# Patient Record
Sex: Female | Born: 1955 | Race: White | Hispanic: No | Marital: Married | Smoking: Former smoker
Health system: Southern US, Community
[De-identification: ages and names within clinical notes are randomized; demographics above are authoritative.]

## PROBLEM LIST (undated history)

## (undated) DIAGNOSIS — J45909 Unspecified asthma, uncomplicated: Secondary | ICD-10-CM

## (undated) DIAGNOSIS — H8109 Meniere's disease, unspecified ear: Secondary | ICD-10-CM

## (undated) DIAGNOSIS — M069 Rheumatoid arthritis, unspecified: Secondary | ICD-10-CM

## (undated) HISTORY — DX: Rheumatoid arthritis, unspecified: M06.9

## (undated) HISTORY — DX: Unspecified asthma, uncomplicated: J45.909

## (undated) HISTORY — PX: ABDOMINAL HYSTERECTOMY: SHX81

## (undated) HISTORY — PX: CHOLECYSTECTOMY: SHX55

---

## 2014-06-05 ENCOUNTER — Encounter: Payer: Self-pay | Admitting: Physician Assistant

## 2014-06-05 ENCOUNTER — Ambulatory Visit (INDEPENDENT_AMBULATORY_CARE_PROVIDER_SITE_OTHER): Payer: PRIVATE HEALTH INSURANCE | Admitting: Physician Assistant

## 2014-06-05 VITALS — BP 142/84 | HR 105 | Ht 61.0 in | Wt 181.0 lb

## 2014-06-05 DIAGNOSIS — M069 Rheumatoid arthritis, unspecified: Secondary | ICD-10-CM

## 2014-06-05 DIAGNOSIS — J45901 Unspecified asthma with (acute) exacerbation: Secondary | ICD-10-CM

## 2014-06-05 DIAGNOSIS — J45909 Unspecified asthma, uncomplicated: Secondary | ICD-10-CM | POA: Insufficient documentation

## 2014-06-05 MED ORDER — MONTELUKAST SODIUM 10 MG PO TABS: 10.0000 mg | ORAL_TABLET | Freq: Every day | ORAL | Status: DC

## 2014-06-05 MED ORDER — AZITHROMYCIN 250 MG PO TABS: ORAL_TABLET | ORAL | Status: DC

## 2014-06-05 MED ORDER — IPRATROPIUM-ALBUTEROL 0.5-2.5 (3) MG/3ML IN SOLN
3.0000 mL | RESPIRATORY_TRACT | Status: DC
  Administered 2014-06-05: 3 mL via RESPIRATORY_TRACT

## 2014-06-05 NOTE — Patient Instructions (Signed)
Start zpak.  Start singulair Continue albuterol 2puffs every 4-6 hours for next 4 days.   Bronchitis Bronchitis is inflammation of the airways that extend from the windpipe into the lungs (bronchi). The inflammation often causes mucus to develop, which leads to a cough. If the inflammation becomes severe, it may cause shortness of breath. CAUSES  Bronchitis may be caused by:   Viral infections.   Bacteria.   Cigarette smoke.   Allergens, pollutants, and other irritants.  SIGNS AND SYMPTOMS  The most common symptom of bronchitis is a frequent cough that produces mucus. Other symptoms include:  Fever.   Body aches.   Chest congestion.   Chills.   Shortness of breath.   Sore throat.  DIAGNOSIS  Bronchitis is usually diagnosed through a medical history and physical exam. Tests, such as chest X-rays, are sometimes done to rule out other conditions.  TREATMENT  You may need to avoid contact with whatever caused the problem (smoking, for example). Medicines are sometimes needed. These may include:  Antibiotics. These may be prescribed if the condition is caused by bacteria.  Cough suppressants. These may be prescribed for relief of cough symptoms.   Inhaled medicines. These may be prescribed to help open your airways and make it easier for you to breathe.   Steroid medicines. These may be prescribed for those with recurrent (chronic) bronchitis. HOME CARE INSTRUCTIONS  Get plenty of rest.   Drink enough fluids to keep your urine clear or pale yellow (unless you have a medical condition that requires fluid restriction). Increasing fluids may help thin your secretions and will prevent dehydration.   Only take over-the-counter or prescription medicines as directed by your health care provider.  Only take antibiotics as directed. Make sure you finish them even if you start to feel better.  Avoid secondhand smoke, irritating chemicals, and strong fumes. These  will make bronchitis worse. If you are a smoker, quit smoking. Consider using nicotine gum or skin patches to help control withdrawal symptoms. Quitting smoking will help your lungs heal faster.   Put a cool-mist humidifier in your bedroom at night to moisten the air. This may help loosen mucus. Change the water in the humidifier daily. You can also run the hot water in your shower and sit in the bathroom with the door closed for 5 10 minutes.   Follow up with your health care provider as directed.   Wash your hands frequently to avoid catching bronchitis again or spreading an infection to others.  SEEK MEDICAL CARE IF: Your symptoms do not improve after 1 week of treatment.  SEEK IMMEDIATE MEDICAL CARE IF:  Your fever increases.  You have chills.   You have chest pain.   You have worsening shortness of breath.   You have bloody sputum.  You faint.  You have lightheadedness.  You have a severe headache.   You vomit repeatedly. MAKE SURE YOU:   Understand these instructions.  Will watch your condition.  Will get help right away if you are not doing well or get worse. Document Released: 12/08/2005 Document Revised: 09/28/2013 Document Reviewed: 08/02/2013 [facility] Patient Information 2014 [place], [place].

## 2014-06-07 NOTE — Progress Notes (Signed)
   Subjective:    Patient ID: April Norman, female    DOB: 1956-11-15, 58 y.o.   MRN: [id number]  HPI Pt is a 58 yo female who presents to the clinic to establish care.  Past medical history significant for asthma and rheumatoid arthritis. She just moved to [facility] She still seen her rheumatologist. He is trying to get [place]   Patient's asthma has been historically controlled with albuterol rescue as needed. Since moving to [place] she has felt increasingly short of breath and wheezing. She is using her albuterol inhaler regularly. She's also developed a productive cough. She denies any fever, chills, nausea or vomiting. She's not had any ear pain or sore throat. She has not tried anything to make better. She seems to be coming increasingly short of breath.  . Family History  Problem Relation Age of Onset  . Heart disease Father   . Diabetes Father   . Stroke Father   . Diabetes Brother   . Cancer Paternal Grandmother     stomach    . History   Social History  . Marital Status: Married    Spouse Name: N/A    Number of Children: N/A  . Years of Education: N/A   Occupational History  . Not on file.   Social History Main Topics  . Smoking status: Former Games developer  . Smokeless tobacco: Former Neurosurgeon  . Alcohol Use: No  . Drug Use: No  . Sexual Activity: Yes   Other Topics Concern  . Not on file   Social History Narrative  . No narrative on file   .Marland Kitchen Past Surgical History  Procedure Laterality Date  . Abdominal hysterectomy    . Cholecystectomy        Review of Systems  All other systems reviewed and are negative.      Objective:   Physical Exam  Constitutional: She is oriented to person, place, and time. She appears well-developed and well-nourished.  HENT:  Head: Normocephalic and atraumatic.  Right Ear: External ear normal.  Left Ear: External ear normal.  Nose: Nose normal.  Mouth/Throat:  Oropharynx is clear and moist.  Eyes: Conjunctivae are normal. Right eye exhibits no discharge. Left eye exhibits no discharge.  Neck: Normal range of motion. Neck supple. No thyromegaly present.  Cardiovascular: Regular rhythm and normal heart sounds.   Tachycardia at 105.   Pulmonary/Chest: She has wheezes.  Pulse ox 92 percent.  Wheezing bilateral lungs.  Decreased breath sounds.   Lymphadenopathy:    She has no cervical adenopathy.  Neurological: She is alert and oriented to person, place, and time.  Skin: Skin is dry.  Psychiatric: She has a normal mood and affect. Her behavior is normal.          Assessment & Plan:  Asthma exacerbation/asmatic bronchitis- Pt cannot tolerate prednisone. Upon questioning of inhaled corticosteroids she is unaware of this answer. When I did speak with her allergist her get her repeat allergy testing before anything like that was tried. Duoneb was given in office today with signifcant improvement per pt. Start zpak. I did decided to start singulair it seems that perhaps allergies are setting off her asthma and mucus production. Continue to use albuterol rescue 2-6 hours as needed for SOB. Follow up as needed.   RA- managed by rheumatologist. Patient alert and she has one in [place] to communicate with.

## 2014-06-20 ENCOUNTER — Telehealth: Payer: Self-pay

## 2014-06-20 NOTE — Telephone Encounter (Signed)
LMOM for patient/Stacy Charlann Noss, CMA

## 2014-06-20 NOTE — Telephone Encounter (Signed)
Ok use albuterol every 4 hours to see if helps open up lungs and help to breathe better.

## 2014-06-20 NOTE — Telephone Encounter (Signed)
Call patient and she stated that she has tried Mucinex DM but patient had no change. She stated that she has not been using her inhaler often. Patient has an appointment with you tomorrow afternoon (06/21/2014)/ April Norman, CMA

## 2014-06-20 NOTE — Telephone Encounter (Signed)
April Norman called and stated that she is still having a "very productive cough" and "very congested". She would like to see if she can get a refill. She was seen June 15. SI

## 2014-06-20 NOTE — Telephone Encounter (Signed)
Call pt: did you get better and cough start again or continue? how often is she using albuterol inhaler and is it helping? Continuing to rx abx can be dangerous long term if this is inflammation problem. Have you tried mucinex DM for next couple of days, albuterol regularly and can pick up cough syrup if would like.

## 2014-06-21 ENCOUNTER — Encounter: Payer: Self-pay | Admitting: Physician Assistant

## 2014-06-21 ENCOUNTER — Ambulatory Visit (INDEPENDENT_AMBULATORY_CARE_PROVIDER_SITE_OTHER): Payer: PRIVATE HEALTH INSURANCE

## 2014-06-21 ENCOUNTER — Ambulatory Visit (INDEPENDENT_AMBULATORY_CARE_PROVIDER_SITE_OTHER): Payer: PRIVATE HEALTH INSURANCE | Admitting: Physician Assistant

## 2014-06-21 VITALS — BP 131/76 | HR 100 | Ht 61.0 in | Wt 195.0 lb

## 2014-06-21 DIAGNOSIS — R0989 Other specified symptoms and signs involving the circulatory and respiratory systems: Secondary | ICD-10-CM

## 2014-06-21 DIAGNOSIS — R059 Cough, unspecified: Secondary | ICD-10-CM

## 2014-06-21 DIAGNOSIS — M069 Rheumatoid arthritis, unspecified: Secondary | ICD-10-CM

## 2014-06-21 DIAGNOSIS — R05 Cough: Secondary | ICD-10-CM

## 2014-06-21 MED ORDER — BENZONATATE 200 MG PO CAPS: 200.0000 mg | ORAL_CAPSULE | Freq: Two times a day (BID) | ORAL | Status: DC | PRN

## 2014-06-21 MED ORDER — HYDROXYCHLOROQUINE SULFATE 200 MG PO TABS: 200.0000 mg | ORAL_TABLET | Freq: Two times a day (BID) | ORAL | Status: AC

## 2014-06-21 MED ORDER — HYDROCOD POLST-CHLORPHEN POLST 10-8 MG/5ML PO LQCR
5.0000 mL | Freq: Every evening | ORAL | Status: DC | PRN
Start: 2014-06-21 — End: 2014-10-31

## 2014-06-21 MED ORDER — DOXYCYCLINE HYCLATE 100 MG PO TABS: 100.0000 mg | ORAL_TABLET | Freq: Two times a day (BID) | ORAL | Status: DC

## 2014-06-21 MED ORDER — OMEPRAZOLE 40 MG PO CPDR: 40.0000 mg | DELAYED_RELEASE_CAPSULE | Freq: Every day | ORAL | Status: DC

## 2014-06-26 ENCOUNTER — Telehealth: Payer: Self-pay | Admitting: *Deleted

## 2014-06-26 NOTE — Addendum Note (Signed)
Addended by: Jomarie Longs on: 06/26/2014 02:48 PM   Modules accepted: Orders

## 2014-06-26 NOTE — Telephone Encounter (Signed)
Very sorry. Made referral today.

## 2014-06-26 NOTE — Telephone Encounter (Signed)
Pt notified of referral  

## 2014-06-26 NOTE — Telephone Encounter (Signed)
Pt left vm stating that you were going to send a referral for her to see a rheumatologist.  I didn't see one under referrals or anything in the notes.

## 2014-06-26 NOTE — Progress Notes (Addendum)
   Subjective:    Patient ID: April Norman, female    DOB: 13-Jun-1956, 58 y.o.   MRN: [id number]  HPI Patient is a 58 year old female who presents to the clinic with ongoing cough. Cough started about 3 weeks ago when she moved here from [place]. She came in to the clinic and was started on zpak, singulair and given albuterol inhaler. She she has not really been using her albuterol inhaler. Patient also was not able to continue on her Singulair due to making her feel dizzy and weird. She did get some better but in the last couple days her cough and sputum has increased significantly. She denies any fever, chills, nausea, vomiting, sinus pressure, ear pain, or sore throat. She has tried Mucinex with not a lot of relief. She denies any shortness of breath but does have some chest tightness. Cough is the most nagging. It occurs all day long throughout the day. Since be worse in the morning and at night.   Needs plaquenil refilled today. Does not have rheumatologist here in [place].    Review of Systems  All other systems reviewed and are negative.      Objective:   Physical Exam  Constitutional: She is oriented to person, place, and time. She appears well-developed and well-nourished.  HENT:  Head: Normocephalic and atraumatic.  Right Ear: External ear normal.  Left Ear: External ear normal.  Nose: Nose normal.  Mouth/Throat: Oropharynx is clear and moist.  Eyes: Conjunctivae are normal.  Neck: Normal range of motion. Neck supple.  Cardiovascular: Normal rate, regular rhythm and normal heart sounds.   Pulmonary/Chest: Effort normal and breath sounds normal. She has no wheezes.  Neurological: She is alert and oriented to person, place, and time.  Skin: Skin is dry.  Psychiatric: She has a normal mood and affect. Her behavior is normal.          Assessment & Plan:  Cough- will get CXR today. I continue to think there is some asthmatic component to this cough. If not significantly  better after today's treatment we need to consider spirometry. The bigger question is if she can't tolerate prednisone which should be able to tolerate inhaled corticosteroids. I did switch today to another antibiotic doxycycline. Tussionex was given for cough at bedtime. Tessalon Perles were given for cough throughout the day. Consideration to use albuterol regularly every 2-6 hours for the next 3 days was encouraged. Consider over-the-counter Zyrtec to help like an antihistamine response.  RA- will refill until seen by rheumatologist. Will refer to one in [place].

## 2014-07-06 ENCOUNTER — Other Ambulatory Visit: Payer: Self-pay | Admitting: *Deleted

## 2014-07-06 ENCOUNTER — Telehealth: Payer: Self-pay | Admitting: *Deleted

## 2014-07-06 MED ORDER — SULFASALAZINE 500 MG PO TABS
500.0000 mg | ORAL_TABLET | Freq: Two times a day (BID) | ORAL | Status: DC
Start: 2014-07-06 — End: 2021-05-01

## 2014-07-06 NOTE — Telephone Encounter (Signed)
Pt notified of rx. 

## 2014-07-06 NOTE — Telephone Encounter (Signed)
Ok to refill times 1.

## 2014-07-06 NOTE — Telephone Encounter (Signed)
I made a mistake on her med...she is needing the sulfasalazine 500mg  bid.  [facility] [place].

## 2014-07-06 NOTE — Telephone Encounter (Signed)
Remicade is an injectable IV drug. We do not do this IV in office. Did she normally get this done at her RA office? If so should be ok to wait for until the 20th 4 more days.

## 2014-07-06 NOTE — Telephone Encounter (Signed)
Pt left vm wanting to know if you could fill her rheumicaid (sp) since she can't get in with her rheumatologist until the 20th.  Please advise.

## 2014-07-31 LAB — HM DEXA SCAN

## 2014-08-01 ENCOUNTER — Encounter: Payer: Self-pay | Admitting: Physician Assistant

## 2014-08-01 ENCOUNTER — Telehealth: Payer: Self-pay | Admitting: Physician Assistant

## 2014-08-01 DIAGNOSIS — M8580 Other specified disorders of bone density and structure, unspecified site: Secondary | ICD-10-CM | POA: Insufficient documentation

## 2014-08-01 NOTE — Telephone Encounter (Signed)
Bone denisity showed osteopenia(low bone mass) will recheck in 2 years. Make sure taking 1200mg  of calcium and 800 units of vitamin D daily.

## 2014-08-03 NOTE — Telephone Encounter (Signed)
Pt notified of results & recommendations.

## 2014-08-10 ENCOUNTER — Encounter: Payer: Self-pay | Admitting: Physician Assistant

## 2014-08-11 ENCOUNTER — Telehealth: Payer: Self-pay

## 2014-08-11 ENCOUNTER — Other Ambulatory Visit: Payer: Self-pay | Admitting: Physician Assistant

## 2014-08-11 DIAGNOSIS — R140 Abdominal distension (gaseous): Secondary | ICD-10-CM

## 2014-08-11 DIAGNOSIS — R197 Diarrhea, unspecified: Secondary | ICD-10-CM

## 2014-08-11 MED ORDER — DIPHENOXYLATE-ATROPINE 2.5-0.025 MG PO TABS
ORAL_TABLET | ORAL | Status: DC
Start: 2014-08-11 — End: 2014-10-31

## 2014-08-11 NOTE — Telephone Encounter (Signed)
Mackenzy is calling because she is ready for a GI referral for a Colonoscopy. She is also having diarrhea with some stomach cramping for a couple months. It is now getting worse and she reports having gas along with it. Denies fever, chills, sweats, nausea or vomiting. No signs of blood in her stool. She has taken Imodium and IB gard OTC. The medication helped some in the beginning but now it isn't helping at all. She would like a medication for her diarrhea and a referral to the GI doctor. Please advise.

## 2014-08-11 NOTE — Telephone Encounter (Signed)
Pt notified of rx & referral.

## 2014-08-11 NOTE — Telephone Encounter (Signed)
Send lomotil for diarrhea. Will make referral.

## 2014-08-17 ENCOUNTER — Encounter: Payer: Self-pay | Admitting: Internal Medicine

## 2014-08-21 ENCOUNTER — Other Ambulatory Visit: Payer: Self-pay | Admitting: *Deleted

## 2014-08-21 NOTE — Telephone Encounter (Signed)
No rx refilled. Corliss Skains, CMA

## 2014-08-22 ENCOUNTER — Other Ambulatory Visit: Payer: Self-pay | Admitting: *Deleted

## 2014-08-22 MED ORDER — ALBUTEROL SULFATE HFA 108 (90 BASE) MCG/ACT IN AERS: 2.0000 | INHALATION_SPRAY | Freq: Four times a day (QID) | RESPIRATORY_TRACT | Status: DC | PRN

## 2014-09-19 ENCOUNTER — Ambulatory Visit: Payer: PRIVATE HEALTH INSURANCE | Admitting: Internal Medicine

## 2014-10-05 ENCOUNTER — Other Ambulatory Visit: Payer: Self-pay | Admitting: *Deleted

## 2014-10-05 MED ORDER — ALBUTEROL SULFATE HFA 108 (90 BASE) MCG/ACT IN AERS: 2.0000 | INHALATION_SPRAY | Freq: Four times a day (QID) | RESPIRATORY_TRACT | Status: DC | PRN

## 2014-10-31 ENCOUNTER — Ambulatory Visit (INDEPENDENT_AMBULATORY_CARE_PROVIDER_SITE_OTHER): Payer: PRIVATE HEALTH INSURANCE | Admitting: Family Medicine

## 2014-10-31 ENCOUNTER — Encounter: Payer: Self-pay | Admitting: Family Medicine

## 2014-10-31 ENCOUNTER — Ambulatory Visit (INDEPENDENT_AMBULATORY_CARE_PROVIDER_SITE_OTHER): Payer: PRIVATE HEALTH INSURANCE | Admitting: *Deleted

## 2014-10-31 VITALS — BP 128/73 | HR 105 | Temp 97.9°F | Ht 61.0 in | Wt 191.0 lb

## 2014-10-31 DIAGNOSIS — R05 Cough: Secondary | ICD-10-CM | POA: Diagnosis not present

## 2014-10-31 DIAGNOSIS — Z23 Encounter for immunization: Secondary | ICD-10-CM

## 2014-10-31 DIAGNOSIS — R059 Cough, unspecified: Secondary | ICD-10-CM

## 2014-10-31 MED ORDER — FLUTICASONE PROPIONATE HFA 110 MCG/ACT IN AERO: 2.0000 | INHALATION_SPRAY | Freq: Two times a day (BID) | RESPIRATORY_TRACT | Status: DC

## 2014-10-31 MED ORDER — DOXYCYCLINE HYCLATE 100 MG PO TABS: 100.0000 mg | ORAL_TABLET | Freq: Two times a day (BID) | ORAL | Status: DC

## 2014-10-31 MED ORDER — ALBUTEROL SULFATE (2.5 MG/3ML) 0.083% IN NEBU
2.5000 mg | INHALATION_SOLUTION | Freq: Once | RESPIRATORY_TRACT | Status: AC
  Administered 2014-10-31: 2.5 mg via RESPIRATORY_TRACT

## 2014-10-31 NOTE — Progress Notes (Signed)
   Subjective:    Patient ID: April Norman, female    DOB: [birth year], 58 y.o.   MRN: [id number]  HPI Yesterday started feeling bad with cough, productive, SOB.  Some mild congestion.  No fever, chills or sweats. Has been using her albuterol, 2 puffs.  No known sick contacts. No problems with allergies.     Review of Systems     Objective:   Physical Exam  Constitutional: She is oriented to person, place, and time. She appears well-developed and well-nourished.  HENT:  Head: Normocephalic and atraumatic.  Right Ear: External ear normal.  Left Ear: External ear normal.  Nose: Nose normal.  Mouth/Throat: Oropharynx is clear and moist.  TMs and canals are clear.   Eyes: Conjunctivae and EOM are normal. Pupils are equal, round, and reactive to light.  Neck: Neck supple. No thyromegaly present.  Cardiovascular: Normal rate, regular rhythm and normal heart sounds.   Pulmonary/Chest: Effort normal. She has wheezes.  Inspiratory and expiratory wheezing as well as diffuse rhonchi. Cough sounds productive.  Lymphadenopathy:    She has no cervical adenopathy.  Neurological: She is alert and oriented to person, place, and time.  Skin: Skin is warm and dry.  Psychiatric: She has a normal mood and affect.          Assessment & Plan:  Asthma exacerbation -given 2 nebulizer albuterol treatments here in the office today. Final peak flow was in the green zone. will start flovent daily for maintenance.  Increase albuterol use. She is unable to tolerate prednisone so put her on doxycycline which also has an anti-inflammatory effect as well. Will maximize her albuterol use. Follow up in 2 months. Call sooner if not improving. Okay to use expectorants over-the-counter.

## 2014-10-31 NOTE — Patient Instructions (Signed)
Increase albuterol to 4 puffs every 4 hours for the next few days. If you're certain to feel better can decrease to 4 puffs every 6 hours and then gradually go back down to 2 puffs as needed. Restart the Flovent as a maintenance asthma medications since she had had a couple of flares in the last few months. He will use 2 puffs twice a day every day until you come back to see Lesly Rubenstein in 2 months.

## 2014-11-08 ENCOUNTER — Telehealth: Payer: Self-pay | Admitting: *Deleted

## 2014-11-08 MED ORDER — MOMETASONE FUROATE 220 MCG/INH IN AEPB: 2.0000 | INHALATION_SPRAY | Freq: Every day | RESPIRATORY_TRACT | Status: DC

## 2014-11-08 NOTE — Telephone Encounter (Signed)
New rx sent

## 2014-11-08 NOTE — Telephone Encounter (Signed)
Pt called and lvm stating that her insurance will not pay for the flovent that Dr. Linford Arnold perscibed for her. She is asking for an alternative to be sent to her pharmacy [facility].Loralee Pacas [place]

## 2014-11-09 NOTE — Telephone Encounter (Signed)
Pt informed.April Norman  

## 2014-12-31 ENCOUNTER — Encounter: Payer: Self-pay | Admitting: Emergency Medicine

## 2014-12-31 ENCOUNTER — Emergency Department (INDEPENDENT_AMBULATORY_CARE_PROVIDER_SITE_OTHER)
Admission: EM | Admit: 2014-12-31 | Discharge: 2014-12-31 | Disposition: A | Discharge status: Home / Self Care | Payer: PRIVATE HEALTH INSURANCE | Source: Home / Self Care | Attending: Family Medicine | Admitting: Family Medicine

## 2014-12-31 DIAGNOSIS — H8111 Benign paroxysmal vertigo, right ear: Secondary | ICD-10-CM

## 2014-12-31 HISTORY — DX: Meniere's disease, unspecified ear: H81.09

## 2014-12-31 MED ORDER — MECLIZINE HCL 25 MG PO TABS: 25.0000 mg | ORAL_TABLET | Freq: Three times a day (TID) | ORAL | Status: DC | PRN

## 2014-12-31 NOTE — ED Provider Notes (Signed)
April Norman is a 59 y.o. female who presents to Urgent Care today for vertigo. Ago. Patient developed room spinning vertigo type sensation this morning. Symptoms last about 30 seconds and are worse with head positioning. Symptoms are consistent with prior episodes of vertigo. No injury change in hearing fevers or chills. She feels well otherwise.   Past Medical History  Diagnosis Date  . Rheumatoid arthritis   . Asthma   . Vertigo, labyrinthine    Past Surgical History  Procedure Laterality Date  . Abdominal hysterectomy    . Cholecystectomy     History  Substance Use Topics  . Smoking status: Former Games developer  . Smokeless tobacco: Former Neurosurgeon  . Alcohol Use: No   ROS as above Medications: No current facility-administered medications for this encounter.   Current Outpatient Prescriptions  Medication Sig Dispense Refill  . albuterol (PROVENTIL HFA;VENTOLIN HFA) 108 (90 BASE) MCG/ACT inhaler Inhale 2 puffs into the lungs every 6 (six) hours as needed for wheezing or shortness of breath. 1 Inhaler 2  . Calcium-Magnesium-Vitamin D (CALCIUM 500 PO) Take by mouth.    . Cholecalciferol (VITAMIN D PO) Take by mouth.    . doxycycline (VIBRA-TABS) 100 MG tablet Take 1 tablet (100 mg total) by mouth 2 (two) times daily. 20 tablet 0  . hydroxychloroquine (PLAQUENIL) 200 MG tablet Take 1 tablet (200 mg total) by mouth 2 (two) times daily. 60 tablet 1  . leflunomide (ARAVA) 20 MG tablet Take 20 mg by mouth daily.    . meclizine (ANTIVERT) 25 MG tablet Take 1 tablet (25 mg total) by mouth 3 (three) times daily as needed. 30 tablet 0  . mometasone (ASMANEX 60 METERED DOSES) 220 MCG/INH inhaler Inhale 2 puffs into the lungs daily. 1 Inhaler 3  . sulfaSALAzine (AZULFIDINE) 500 MG tablet Take 1 tablet (500 mg total) by mouth 2 (two) times daily. 60 tablet 0   Allergies  Allergen Reactions  . Prednazoline Swelling  . Prednisone     Swelling of face, neck, esphagus  . Singulair [Montelukast  Sodium]     diarrhea     Exam:  BP 127/76 mmHg  Pulse 87  Temp(Src) 97.8 F (36.6 C) (Oral)  Resp 16  Ht 5\' 2"  (1.575 m)  Wt 183 lb (83.008 kg)  BMI 33.46 kg/m2  SpO2 100% Gen: Well NAD HEENT: EOMI,  MMM normal tympanic membranes bilaterally Lungs: Normal work of breathing. CTABL Heart: RRR no MRG Abd: NABS, Soft. Nondistended, Nontender Exts: Brisk capillary refill, warm and well perfused.  Neuro: Positive right-sided . Normal balance and coordination otherwise normal gait.  No results found for this or any previous visit (from the past 24 hour(s)). No results found.  Assessment and Plan: 59 y.o. 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 Treat with Epley maneuver meclizine and vestibular physical therapy. Follow-up with PCP.  Discussed warning signs or symptoms. Please see discharge instructions. Patient expresses understanding.     61, MD 12/31/14 [id number]

## 2014-12-31 NOTE — Discharge Instructions (Signed)
Thank you for coming in today.  Perform the right-sided Epley maneuver. Look for descriptions on YouTube   Epley Maneuver Self-Care WHAT IS THE EPLEY MANEUVER? The Epley maneuver is an exercise you can do to relieve symptoms of benign paroxysmal positional vertigo (BPPV). This condition is often just referred to as vertigo. BPPV is caused by the movement of tiny crystals (canaliths) inside your inner ear. The accumulation and movement of canaliths in your inner ear causes a sudden spinning sensation (vertigo) when you move your head to certain positions. Vertigo usually lasts about 30 seconds. BPPV usually occurs in just one ear. If you get vertigo when you lie on your left side, you probably have BPPV in your left ear. Your health care provider can tell you which ear is involved.  BPPV may be caused by a head injury. Many people older than 50 get BPPV for unknown reasons. If you have been diagnosed with BPPV, your health care provider may teach you how to do this maneuver. BPPV is not life threatening (benign) and usually goes away in time.  WHEN SHOULD I PERFORM THE EPLEY MANEUVER? You can do this maneuver at home whenever you have symptoms of vertigo. You may do the Epley maneuver up to 3 times a day until your symptoms of vertigo go away. HOW SHOULD I DO THE EPLEY MANEUVER?  Sit on the edge of a bed or table with your back straight. Your legs should be extended or hanging over the edge of the bed or table.   Turn your head halfway toward the affected ear.   Lie backward quickly with your head turned until you are lying flat on your back. You may want to position a pillow under your shoulders.   Hold this position for 30 seconds. You may experience an attack of vertigo. This is normal. Hold this position until the vertigo stops.  Then turn your head to the opposite direction until your unaffected ear is facing the floor.   Hold this position for 30 seconds. You may experience an attack  of vertigo. This is normal. Hold this position until the vertigo stops.  Now turn your whole body to the same side as your head. Hold for another 30 seconds.   You can then sit back up. ARE THERE RISKS TO THIS MANEUVER? In some cases, you may have other symptoms (such as changes in your vision, weakness, or numbness). If you have these symptoms, stop doing the maneuver and call your health care provider. Even if doing these maneuvers relieves your vertigo, you may still have dizziness. Dizziness is the sensation of light-headedness but without the sensation of movement. Even though the Epley maneuver may relieve your vertigo, it is possible that your symptoms will return within 5 years. WHAT SHOULD I DO AFTER THIS MANEUVER? After doing the Epley maneuver, you can return to your normal activities. Ask your doctor if there is anything you should do at home to prevent vertigo. This may include:  Sleeping with two or more pillows to keep your head elevated.  Not sleeping on the side of your affected ear.  Getting up slowly from bed.  Avoiding sudden movements during the day.  Avoiding extreme head movement, like looking up or bending over.  Wearing a cervical collar to prevent sudden head movements. WHAT SHOULD I DO IF MY SYMPTOMS GET WORSE? Call your health care provider if your vertigo gets worse. Call your provider right way if you have other symptoms, including:  Nausea.  Vomiting.  Headache.  Weakness.  Numbness.  Vision changes. Document Released: 12/13/2013 Document Reviewed: 12/13/2013 [facility] Patient Information 2015 [place], [place]. This information is not intended to replace advice given to you by your health care provider. Make sure you discuss any questions you have with your health care provider.    Benign Positional Vertigo Vertigo means you feel like you or your surroundings are moving when they are not. Benign positional vertigo is the most common form of  vertigo. Benign means that the cause of your condition is not serious. Benign positional vertigo is more common in older adults. CAUSES  Benign positional vertigo is the result of an upset in the labyrinth system. This is an area in the middle ear that helps control your balance. This may be caused by a viral infection, head injury, or repetitive motion. However, often no specific cause is found. SYMPTOMS  Symptoms of benign positional vertigo occur when you move your head or eyes in different directions. Some of the symptoms may include:  Loss of balance and falls.  Vomiting.  Blurred vision.  Dizziness.  Nausea.  Involuntary eye movements (nystagmus). DIAGNOSIS  Benign positional vertigo is usually diagnosed by physical exam. If the specific cause of your benign positional vertigo is unknown, your caregiver may perform imaging tests, such as magnetic resonance imaging (MRI) or computed tomography (CT). TREATMENT  Your caregiver may recommend movements or procedures to correct the benign positional vertigo. Medicines such as meclizine, benzodiazepines, and medicines for nausea may be used to treat your symptoms. In rare cases, if your symptoms are caused by certain conditions that affect the inner ear, you may need surgery. HOME CARE INSTRUCTIONS   Follow your caregiver's instructions.  Move slowly. Do not make sudden body or head movements.  Avoid driving.  Avoid operating heavy machinery.  Avoid performing any tasks that would be dangerous to you or others during a vertigo episode.  Drink enough fluids to keep your urine clear or pale yellow. SEEK IMMEDIATE MEDICAL CARE IF:   You develop problems with walking, weakness, numbness, or using your arms, hands, or legs.  You have difficulty speaking.  You develop severe headaches.  Your nausea or vomiting continues or gets worse.  You develop visual changes.  Your family or friends notice any behavioral changes.  Your  condition gets worse.  You have a fever.  You develop a stiff neck or sensitivity to light. MAKE SURE YOU:   Understand these instructions.  Will watch your condition.  Will get help right away if you are not doing well or get worse. Document Released: 09/15/2006 Document Revised: 03/01/2012 Document Reviewed: 08/28/2011 [facility] Patient Information 2015 [place], [place]. This information is not intended to replace advice given to you by your health care provider. Make sure you discuss any questions you have with your health care provider.

## 2014-12-31 NOTE — ED Notes (Signed)
Reports awakening with severe dizziness, nausea and vomiting; has history of inner ear problems but no remaining medications at home.

## 2015-01-01 ENCOUNTER — Other Ambulatory Visit: Payer: PRIVATE HEALTH INSURANCE | Admitting: Physician Assistant

## 2015-01-02 ENCOUNTER — Encounter: Payer: Self-pay | Admitting: Physician Assistant

## 2015-01-02 ENCOUNTER — Ambulatory Visit (INDEPENDENT_AMBULATORY_CARE_PROVIDER_SITE_OTHER): Payer: PRIVATE HEALTH INSURANCE | Admitting: Physician Assistant

## 2015-01-02 VITALS — BP 133/67 | HR 92 | Ht 62.0 in | Wt 188.0 lb

## 2015-01-02 DIAGNOSIS — K635 Polyp of colon: Secondary | ICD-10-CM | POA: Insufficient documentation

## 2015-01-02 DIAGNOSIS — M069 Rheumatoid arthritis, unspecified: Secondary | ICD-10-CM

## 2015-01-02 DIAGNOSIS — Z131 Encounter for screening for diabetes mellitus: Secondary | ICD-10-CM

## 2015-01-02 DIAGNOSIS — J45909 Unspecified asthma, uncomplicated: Secondary | ICD-10-CM | POA: Insufficient documentation

## 2015-01-02 DIAGNOSIS — Z13220 Encounter for screening for lipoid disorders: Secondary | ICD-10-CM

## 2015-01-02 MED ORDER — MOMETASONE FUROATE 220 MCG/INH IN AEPB: 2.0000 | INHALATION_SPRAY | Freq: Every day | RESPIRATORY_TRACT | Status: DC

## 2015-01-02 NOTE — Patient Instructions (Signed)
Lipid panel and CMP 

## 2015-01-02 NOTE — Progress Notes (Signed)
   Subjective:    Patient ID: April Norman, female    DOB: September 01, 1956, 59 y.o.   MRN: [id number]  HPI Pt presents to the clinic today to have spironmetry done on asthma/cough. Symptoms currently controlled with asmanex 2 puffs daily. No problems or concerns today.    Review of Systems  All other systems reviewed and are negative.      Objective:   Physical Exam  Constitutional: She is oriented to person, place, and time. She appears well-developed and well-nourished.  HENT:  Head: Normocephalic and atraumatic.  Cardiovascular: Normal rate, regular rhythm and normal heart sounds.   Pulmonary/Chest: Effort normal and breath sounds normal. She has no wheezes.  Neurological: She is alert and oriented to person, place, and time.  Skin: Skin is dry.  Psychiatric: She has a normal mood and affect. Her behavior is normal.          Assessment & Plan:  Asthma- spironmetry done FVC, FEV1 normal. FEF 25-75 percent of change was 32 percent represents asthma but no COPD component. Continue with asmanex as long as controlling symptoms. Follow up in 6 months. Refills given.   RA- found new doctor syed in [place].   Needs CPE. Discussed need for labs. Printed and discussed to have drawn and schedule CPE. Needs Tdap declined today.

## 2015-01-15 ENCOUNTER — Encounter: Payer: Self-pay | Admitting: Physician Assistant

## 2015-03-26 ENCOUNTER — Telehealth: Payer: Self-pay | Admitting: *Deleted

## 2015-03-26 NOTE — Telephone Encounter (Signed)
Patient called in asking for referral to Rhuematologist

## 2015-03-27 NOTE — Telephone Encounter (Signed)
I see a referral made for rheumatology for Dr. Kathi Ludwig 06/26/2014 did you ever make this appt?

## 2015-03-27 NOTE — Telephone Encounter (Signed)
Left VM: I see a referral made for rheumatology for Dr. Kathi Ludwig 06/26/2014 did you ever make this appt?

## 2015-03-29 ENCOUNTER — Telehealth: Payer: Self-pay | Admitting: *Deleted

## 2015-03-29 DIAGNOSIS — M069 Rheumatoid arthritis, unspecified: Secondary | ICD-10-CM

## 2015-03-29 NOTE — Telephone Encounter (Signed)
Patient called stating that she needs another referral for Rheumatologists. She has been seeing Dr Kathi Ludwig but it's to costly on her behalf. She has found another doctor that would  accept her insurance & they also do the infusions she needs in the office. The cost is much more   feasible for her. Dr Judith Blonder [phone].

## 2015-03-29 NOTE — Telephone Encounter (Signed)
Ok to place referral for preferred rheumatologist.

## 2015-03-29 NOTE — Telephone Encounter (Signed)
Referral placed.

## 2015-09-26 ENCOUNTER — Other Ambulatory Visit: Payer: Self-pay | Admitting: Physician Assistant

## 2015-10-12 ENCOUNTER — Ambulatory Visit (INDEPENDENT_AMBULATORY_CARE_PROVIDER_SITE_OTHER): Payer: PRIVATE HEALTH INSURANCE | Admitting: Physician Assistant

## 2015-10-12 ENCOUNTER — Encounter: Payer: Self-pay | Admitting: Physician Assistant

## 2015-10-12 VITALS — BP 138/74 | HR 93 | Ht 62.0 in | Wt 196.0 lb

## 2015-10-12 DIAGNOSIS — Z114 Encounter for screening for human immunodeficiency virus [HIV]: Secondary | ICD-10-CM

## 2015-10-12 DIAGNOSIS — Z0000 Encounter for general adult medical examination without abnormal findings: Secondary | ICD-10-CM | POA: Diagnosis not present

## 2015-10-12 DIAGNOSIS — Z1159 Encounter for screening for other viral diseases: Secondary | ICD-10-CM

## 2015-10-12 DIAGNOSIS — Z23 Encounter for immunization: Secondary | ICD-10-CM | POA: Diagnosis not present

## 2015-10-12 DIAGNOSIS — Z131 Encounter for screening for diabetes mellitus: Secondary | ICD-10-CM

## 2015-10-12 DIAGNOSIS — H61899 Other specified disorders of external ear, unspecified ear: Secondary | ICD-10-CM | POA: Insufficient documentation

## 2015-10-12 DIAGNOSIS — Z13220 Encounter for screening for lipoid disorders: Secondary | ICD-10-CM

## 2015-10-12 DIAGNOSIS — M79672 Pain in left foot: Secondary | ICD-10-CM

## 2015-10-12 DIAGNOSIS — M0579 Rheumatoid arthritis with rheumatoid factor of multiple sites without organ or systems involvement: Secondary | ICD-10-CM

## 2015-10-12 DIAGNOSIS — M79671 Pain in right foot: Secondary | ICD-10-CM

## 2015-10-12 DIAGNOSIS — Z0184 Encounter for antibody response examination: Secondary | ICD-10-CM

## 2015-10-12 DIAGNOSIS — Z1239 Encounter for other screening for malignant neoplasm of breast: Secondary | ICD-10-CM

## 2015-10-12 DIAGNOSIS — L219 Seborrheic dermatitis, unspecified: Secondary | ICD-10-CM

## 2015-10-12 DIAGNOSIS — H61891 Other specified disorders of right external ear: Secondary | ICD-10-CM

## 2015-10-12 MED ORDER — KETOCONAZOLE 2 % EX CREA: 1.0000 "application " | TOPICAL_CREAM | Freq: Two times a day (BID) | CUTANEOUS | Status: DC

## 2015-10-12 NOTE — Patient Instructions (Signed)
Keeping You Healthy  Get These Tests  Blood Pressure- Have your blood pressure checked by your healthcare April Norman at least once a year.  Normal blood pressure is 120/80.  Weight- Have your body mass index (BMI) calculated to screen for obesity.  BMI is a measure of body fat based on height and weight.  You can calculate your own BMI at www.nhlbisupport.com/bmi/  Cholesterol- Have your cholesterol checked every year.  Diabetes- Have your blood sugar checked every year if you have high blood pressure, high cholesterol, a family history of diabetes or if you are overweight.  Pap Test - Have a pap test every 1 to 5 years if you have been sexually active.  If you are older than 65 and recent pap tests have been normal you may not need additional pap tests.  In addition, if you have had a hysterectomy  for benign disease additional pap tests are not necessary.  Mammogram-Yearly mammograms are essential for early detection of breast cancer  Screening for Colon Cancer- Colonoscopy starting at age 50. Screening may begin sooner depending on your family history and other health conditions.  Follow up colonoscopy as directed by your Gastroenterologist.  Screening for Osteoporosis- Screening begins at age 65 with bone density scanning, sooner if you are at higher risk for developing Osteoporosis.  Get these medicines  Calcium with Vitamin D- Your body requires 1200-1500 mg of Calcium a day and 800-1000 IU of Vitamin D a day.  You can only absorb 500 mg of Calcium at a time therefore Calcium must be taken in 2 or 3 separate doses throughout the day.  Hormones- Hormone therapy has been associated with increased risk for certain cancers and heart disease.  Talk to your healthcare April Norman about if you need relief from menopausal symptoms.  Aspirin- Ask your healthcare April Norman about taking Aspirin to prevent Heart Disease and Stroke.  Get these Immuniztions  Flu shot- Every fall  Pneumonia shot-  Once after the age of 65; if you are younger ask your healthcare April Norman if you need a pneumonia shot.  Tetanus- Every ten years.  Zostavax- Once after the age of 60 to prevent shingles.  Take these steps  Don't smoke- Your healthcare April Norman can help you quit. For tips on how to quit, ask your healthcare April Norman or go to www.smokefree.gov or call 1-800 QUIT-NOW.  Be physically active- Exercise 5 days a week for a minimum of 30 minutes.  If you are not already physically active, start slow and gradually work up to 30 minutes of moderate physical activity.  Try walking, dancing, bike riding, swimming, etc.  Eat a healthy diet- Eat a variety of healthy foods such as fruits, vegetables, whole grains, low fat milk, low fat cheeses, yogurt, lean meats, chicken, fish, eggs, dried beans, tofu, etc.  For more information go to www.thenutritionsource.org  Dental visit- Brush and floss teeth twice daily; visit your dentist twice a year.  Eye exam- Visit your Optometrist or Ophthalmologist yearly.  Drink alcohol in moderation- Limit alcohol intake to one drink or less a day.  Never drink and drive.  Depression- Your emotional health is as important as your physical health.  If you're feeling down or losing interest in things you normally enjoy, please talk to your healthcare April Norman.  Seat Belts- can save your life; always wear one  Smoke/Carbon Monoxide detectors- These detectors need to be installed on the appropriate level of your home.  Replace batteries at least once a year.  Violence- If   anyone is threatening or hurting you, please tell your healthcare April Norman.  Living Will/ Health care power of attorney- Discuss with your healthcare April Norman and family. 

## 2015-10-12 NOTE — Progress Notes (Addendum)
Subjective:     April Norman is a 59 y.o. female and is here for a comprehensive physical exam. The patient reports problems - pt has a nodule in her right ear that has been present for over a year. not painful. not growing. no drainage. tried to pop and nothing came out.    She also has some scaly rash behind both ears. Tried lotions with no results.   She has ongoing RA and complains of bilateral feet pain and ache. Not tried anything to make better. No burning or tingling pain. Pain worse at the end of the day.   Social History   Social History  . Marital Status: Married    Spouse Name: N/A  . Number of Children: N/A  . Years of Education: N/A   Occupational History  . Not on file.   Social History Main Topics  . Smoking status: Former Games developer  . Smokeless tobacco: Former Neurosurgeon  . Alcohol Use: No  . Drug Use: No  . Sexual Activity: Yes   Other Topics Concern  . Not on file   Social History Narrative   Health Maintenance  Topic Date Due  . Hepatitis C Screening  [birth year]  . HIV Screening  10/03/1973  . MAMMOGRAM  01/22/2015  . PAP SMEAR  10/11/2025 (Originally 10/04/1979)  . INFLUENZA VACCINE  07/22/2016  . TETANUS/TDAP  12/22/2021  . COLONOSCOPY  11/23/2024    The following portions of the patient's history were reviewed and updated as appropriate: allergies, current medications, past family history, past medical history, past social history, past surgical history and problem list.  Review of Systems Pertinent items noted in HPI and remainder of comprehensive ROS otherwise negative.   Objective:    BP 138/74 mmHg  Pulse 93  Ht 5\' 2"  (1.575 m)  Wt 196 lb (88.905 kg)  BMI 35.84 kg/m2 General appearance: alert, cooperative, appears stated age and moderately obese Head: Normocephalic, without obvious abnormality, atraumatic Eyes: conjunctivae/corneas clear. PERRL, EOM's intact. Fundi benign. Ears: normal TM's and external ear canals both ears Nose: Nares  normal. Septum midline. Mucosa normal. No drainage or sinus tenderness. Throat: lips, mucosa, and tongue normal; teeth and gums normal Neck: no adenopathy, no carotid bruit, no JVD, supple, symmetrical, trachea midline and thyroid not enlarged, symmetric, no tenderness/mass/nodules Back: symmetric, no curvature. ROM normal. No CVA tenderness. Lungs: clear to auscultation bilaterally Heart: regular rate and rhythm, S1, S2 normal, no murmur, click, rub or gallop Abdomen: soft, non-tender; bowel sounds normal; no masses,  no organomegaly Extremities: extremities normal, atraumatic, no cyanosis or edema Pulses: 2+ and symmetric Skin: Skin color, texture, turgor normal. No rashes or lesions erythematous patch with some maceration and light scales behind bilateral ears. Right external ear small mobile pearly appearance nodule 27mm by 15mm Lymph nodes: Cervical, supraclavicular, and axillary nodes normal. Neurologic: Grossly normal    Assessment:    Healthy female exam.      Plan:    CPE- no need for pap. Hysterectomy with ovaries removed no pap or bimanuel needed. Mammogram ordered.flu shot given. Fasting labs ordered. Pt on calcium and vitamin d. Discussed need for weight loss.  Hep B antibody for titer.  Hep C and HIV ordered.   Nodule of external ear- unclear etiology. Appears to be a calcium deposit. Will send to dermatology.   seborrheic dermatitis- discussed using head and shoulders behind ear. Also given ketoconazole to use. Follow up if not improving.   Bilateral feet pain- does not sound liike  plantar fascitis. follow up with rheumatologist.   See After Visit Summary for Counseling Recommendations

## 2015-10-15 DIAGNOSIS — M79672 Pain in left foot: Secondary | ICD-10-CM

## 2015-10-15 DIAGNOSIS — M79671 Pain in right foot: Secondary | ICD-10-CM | POA: Insufficient documentation

## 2015-12-19 ENCOUNTER — Ambulatory Visit (INDEPENDENT_AMBULATORY_CARE_PROVIDER_SITE_OTHER): Payer: PRIVATE HEALTH INSURANCE

## 2015-12-19 ENCOUNTER — Other Ambulatory Visit: Payer: Self-pay | Admitting: Family Medicine

## 2015-12-19 DIAGNOSIS — Z1231 Encounter for screening mammogram for malignant neoplasm of breast: Secondary | ICD-10-CM | POA: Diagnosis not present

## 2015-12-20 LAB — COMPLETE METABOLIC PANEL WITH GFR
ALT: 27 U/L (ref 6–29)
AST: 24 U/L (ref 10–35)
Albumin: 4.2 g/dL (ref 3.6–5.1)
Alkaline Phosphatase: 67 U/L (ref 33–130)
BUN: 15 mg/dL (ref 7–25)
CALCIUM: 9.2 mg/dL (ref 8.6–10.4)
CHLORIDE: 104 mmol/L (ref 98–110)
CO2: 27 mmol/L (ref 20–31)
Creat: 0.69 mg/dL (ref 0.50–1.05)
Glucose, Bld: 92 mg/dL (ref 65–99)
POTASSIUM: 4.6 mmol/L (ref 3.5–5.3)
Sodium: 143 mmol/L (ref 135–146)
Total Bilirubin: 0.5 mg/dL (ref 0.2–1.2)
Total Protein: 6.9 g/dL (ref 6.1–8.1)

## 2015-12-20 LAB — LIPID PANEL
CHOL/HDL RATIO: 2.4 ratio (ref <=5.0)
Cholesterol: 263 mg/dL — ABNORMAL HIGH (ref 125–200)
HDL: 108 mg/dL (ref >=46)
LDL Cholesterol: 143 mg/dL — ABNORMAL HIGH (ref <130)
TRIGLYCERIDES: 59 mg/dL (ref <150)
VLDL: 12 mg/dL (ref <30)

## 2015-12-20 LAB — HEPATITIS B SURFACE ANTIBODY,QUALITATIVE: Hep B S Ab: POSITIVE — AB

## 2015-12-20 LAB — HIV ANTIBODY (ROUTINE TESTING W REFLEX): HIV 1&2 Ab, 4th Generation: NONREACTIVE

## 2015-12-20 LAB — VITAMIN B12: VITAMIN B 12: 550 pg/mL (ref 211–911)

## 2015-12-20 LAB — HEPATITIS C ANTIBODY: HCV AB: NEGATIVE

## 2015-12-20 LAB — VITAMIN D 25 HYDROXY (VIT D DEFICIENCY, FRACTURES): Vit D, 25-Hydroxy: 29 ng/mL — ABNORMAL LOW (ref 30–100)

## 2015-12-25 ENCOUNTER — Telehealth: Payer: Self-pay

## 2015-12-25 NOTE — Telephone Encounter (Signed)
Left detailed message informing patient of lab results and recommendations. 

## 2015-12-25 NOTE — Telephone Encounter (Signed)
-----   Message from Jomarie Longs, [place] sent at 12/25/2015 12:47 PM EST ----- Call pt: HIV negative. Hep C negative. Immunity to hep B. Vitamin d still just a tad low increase vitamin d by 1000 units daily. b12 looks good. Liver, kidney, glucose looks good. ldl 143 goal under 130. Work on low fat diet and weight loss. HDL is wonderful!!!!.

## 2015-12-28 ENCOUNTER — Ambulatory Visit (INDEPENDENT_AMBULATORY_CARE_PROVIDER_SITE_OTHER): Payer: PRIVATE HEALTH INSURANCE | Admitting: Physician Assistant

## 2015-12-28 ENCOUNTER — Encounter: Payer: Self-pay | Admitting: Physician Assistant

## 2015-12-28 VITALS — BP 141/76 | HR 92 | Ht 62.0 in | Wt 207.0 lb

## 2015-12-28 DIAGNOSIS — M76822 Posterior tibial tendinitis, left leg: Secondary | ICD-10-CM

## 2015-12-28 MED ORDER — MELOXICAM 15 MG PO TABS: 15.0000 mg | ORAL_TABLET | Freq: Every day | ORAL | Status: DC

## 2015-12-28 NOTE — Progress Notes (Signed)
   Subjective:    Patient ID: April Norman, female    DOB: [birth year], 58 y.o.   MRN: [id number]  HPI  Patient is a 60 year old female who presents to the clinic with 3 weeks of medial left ankle pain. She does not remember any injury. She does stand a lot at work but only works 3 days a week. When not standing 0/10 pain with standing pt reports 7-10/10 pain scale. Describes pain as a fire and shooting from behind ankle into left foot. Never had anything like this before. Not done anything to make better.      Review of Systems  All other systems reviewed and are negative.      Objective:   Physical Exam  Constitutional: She is oriented to person, place, and time. She appears well-developed and well-nourished.  Musculoskeletal:  Full ROM of left ankle.] No bony tenderness to palpation.  Tenderness starts just behind medial malleolus and extends to insertion over navicular bone.  Pain with strength testing left to right.  No pain over heel or fascia.  Strength of left foot 5/5.   Neurological: She is alert and oriented to person, place, and time.  Psychiatric: She has a normal mood and affect. Her behavior is normal.          Assessment & Plan:  Left ankle posterior tibial tendonitis- placed in lace up ankle brace. mobic given to take daily for next 2 weeks. pennsaid samples given to apply over ankle twice a day. Rest and ice and elevate. HO given with exercise to start tomorrow. If not improving in next 2 weeks follow up with sports med. May need to consider boot for immobilization.

## 2015-12-31 DIAGNOSIS — M76829 Posterior tibial tendinitis, unspecified leg: Secondary | ICD-10-CM | POA: Insufficient documentation

## 2015-12-31 MED ORDER — DICLOFENAC SODIUM 2 % TD SOLN: 1.0000 "application " | Freq: Two times a day (BID) | TRANSDERMAL | Status: DC

## 2016-01-28 ENCOUNTER — Other Ambulatory Visit: Payer: Self-pay | Admitting: Family Medicine

## 2016-02-08 ENCOUNTER — Ambulatory Visit (INDEPENDENT_AMBULATORY_CARE_PROVIDER_SITE_OTHER): Payer: PRIVATE HEALTH INSURANCE | Admitting: Physician Assistant

## 2016-02-08 ENCOUNTER — Encounter: Payer: Self-pay | Admitting: Physician Assistant

## 2016-02-08 VITALS — BP 130/66 | HR 117 | Temp 100.7°F | Ht 62.0 in | Wt 203.0 lb

## 2016-02-08 DIAGNOSIS — R52 Pain, unspecified: Secondary | ICD-10-CM

## 2016-02-08 DIAGNOSIS — R509 Fever, unspecified: Secondary | ICD-10-CM

## 2016-02-08 DIAGNOSIS — J101 Influenza due to other identified influenza virus with other respiratory manifestations: Secondary | ICD-10-CM

## 2016-02-08 LAB — POCT INFLUENZA A/B
INFLUENZA A, POC: POSITIVE — AB
Influenza B, POC: NEGATIVE

## 2016-02-08 MED ORDER — OSELTAMIVIR PHOSPHATE 75 MG PO CAPS: 75.0000 mg | ORAL_CAPSULE | Freq: Two times a day (BID) | ORAL | Status: AC

## 2016-02-08 NOTE — Patient Instructions (Signed)

## 2016-02-08 NOTE — Progress Notes (Addendum)
   Subjective:    Patient ID: April Norman, female    DOB: [birth year], 60 y.o.   MRN: [id number]  HPI  Patient is a 60 year old female presenting with malaise, fatigue, body aches and headache for one day. Patient has a past medical history relevant for asthma and rheumatoid arthritis. Patient denies nausea, vomiting, diarrhea, pharyngitis, constipation, dysuria, hematuria and recent travel. Patient has had sick contacts at work. Patient received flu vaccination. Patient has taken Mucinex, which has minimally alleviated her symptoms.   Review of Systems  Please see HPI   Objective:   Physical Exam  Constitutional: She is oriented to person, place, and time. She appears well-developed and well-nourished.  HENT:  Head: Normocephalic and atraumatic.  Right Ear: External ear normal.  Left Ear: External ear normal.  Nose: Nose normal.  Mouth/Throat: Oropharynx is clear and moist. No oropharyngeal exudate.  Eyes: Conjunctivae and EOM are normal. Pupils are equal, round, and reactive to light. Right eye exhibits no discharge. Left eye exhibits no discharge.  Neck: Normal range of motion. Neck supple. No thyromegaly present.  Cardiovascular: Normal rate, normal heart sounds and intact distal pulses.   Pulmonary/Chest: Effort normal and breath sounds normal. No respiratory distress. She has no wheezes. She has no rales.  Abdominal: Soft. Bowel sounds are normal. She exhibits no distension and no mass. There is no rebound and no guarding.  Patient has RUQ tenderness.   Musculoskeletal: Normal range of motion.  Lymphadenopathy:    She has no cervical adenopathy.  Neurological: She is alert and oriented to person, place, and time. No cranial nerve deficit.  Psychiatric: She has a normal mood and affect. Her behavior is normal. Judgment and thought content normal.      Assessment & Plan:   1. Influenza A. Patient has presented with fatigue, malaise, body aches and headaches. Patient's in  office rapid Influenza was positive for Influenza A. Patient was prescribed 75 mg of Tamiflu BID for five days. Patient education was provided regarding the general progression of influenza. Patient was advised to rest and stay hydrated. All patient questions were answered.

## 2016-10-23 ENCOUNTER — Encounter: Payer: Self-pay | Admitting: *Deleted

## 2016-10-23 ENCOUNTER — Emergency Department
Admission: EM | Admit: 2016-10-23 | Discharge: 2016-10-23 | Disposition: A | Discharge status: Home / Self Care | Payer: PRIVATE HEALTH INSURANCE | Source: Home / Self Care | Attending: Family Medicine | Admitting: Family Medicine

## 2016-10-23 DIAGNOSIS — J069 Acute upper respiratory infection, unspecified: Secondary | ICD-10-CM | POA: Diagnosis not present

## 2016-10-23 DIAGNOSIS — B9789 Other viral agents as the cause of diseases classified elsewhere: Secondary | ICD-10-CM

## 2016-10-23 MED ORDER — DOXYCYCLINE HYCLATE 100 MG PO CAPS: 100.0000 mg | ORAL_CAPSULE | Freq: Two times a day (BID) | ORAL | 0 refills | Status: DC

## 2016-10-23 NOTE — ED Provider Notes (Signed)
[facility]    CSN: [id number] Arrival date & time: 10/23/16  1533     History   Chief Complaint Chief Complaint  Patient presents with  . Sinus Problem    HPI April Norman is a 60 y.o. female.   Patient complains of two day history of typical cold-like symptoms including mild sore throat, sinus congestion, headache, fatigue, chills, and cough.  She has asthma, and has had to use her albuterol inhaler more frequently. She has rheumatoid arthritis, controlled with Remicade and Plaquenil.   The history is provided by the patient.    Past Medical History:  Diagnosis Date  . Asthma   . Rheumatoid arthritis (HCC)   . Vertigo, labyrinthine     Patient Active Problem List   Diagnosis Date Noted  . Posterior tibial tendonitis 12/31/2015  . Bilateral foot pain 10/15/2015  . Nodule of external ear 10/12/2015  . Seborrheic dermatitis 10/12/2015  . Asthma, chronic 01/02/2015  . Hyperplastic colon polyp 01/02/2015  . Osteopenia 08/01/2014  . Rheumatoid arthritis (HCC) 06/05/2014  . Asthma 06/05/2014    Past Surgical History:  Procedure Laterality Date  . ABDOMINAL HYSTERECTOMY    . CHOLECYSTECTOMY      OB History    No data available       Home Medications    Prior to Admission medications   Medication Sig Start Date End Date Taking? Authorizing Provider  InFLIXimab (REMICADE IV) Inject into the vein.   Yes Historical Provider, MD  albuterol (PROVENTIL HFA;VENTOLIN HFA) 108 (90 BASE) MCG/ACT inhaler Inhale 2 puffs into the lungs every 6 (six) hours as needed for wheezing or shortness of breath. 10/05/14   Jade L Breeback, PA-C  ASMANEX 60 METERED DOSES 220 MCG/INH inhaler INHALE 2 PUFFS INTO THE LUNGS DAILY. 01/29/16   Jade L Breeback, PA-C  Calcium-Magnesium-Vitamin D (CALCIUM 500 PO) Take by mouth.    Historical Provider, MD  Diclofenac Sodium 2 % SOLN Place 1 application onto the skin 2 (two) times daily. 12/31/15   Jade L Breeback, PA-C    doxycycline (VIBRAMYCIN) 100 MG capsule Take 1 capsule (100 mg total) by mouth 2 (two) times daily. Take with food. 10/23/16   Lattie Haw, MD  hydroxychloroquine (PLAQUENIL) 200 MG tablet Take 1 tablet (200 mg total) by mouth 2 (two) times daily. 06/21/14   Jade L Breeback, PA-C  inFLIXimab in sodium chloride 0.9 % Inject into the vein every 6 (six) weeks.    Historical Provider, MD  ketoconazole (NIZORAL) 2 % cream Apply 1 application topically 2 (two) times daily. For 2-4 weeks. 10/12/15   Jade L Breeback, PA-C  meloxicam (MOBIC) 15 MG tablet Take 1 tablet (15 mg total) by mouth daily. 12/28/15   Jade L Breeback, PA-C  mometasone (ASMANEX 60 METERED DOSES) 220 MCG/INH inhaler INHALE 2 PUFFS INTO THE LUNGS DAILY. Due for follow up appointment. 09/26/15   Jade L Breeback, PA-C  sulfaSALAzine (AZULFIDINE) 500 MG tablet Take 1 tablet (500 mg total) by mouth 2 (two) times daily. 07/06/14   Jomarie Longs, PA-C    Family History Family History  Problem Relation Age of Onset  . Heart disease Father   . Diabetes Father   . Stroke Father   . Diabetes Brother   . Stomach cancer Paternal Grandmother     Social History Social History  Substance Use Topics  . Smoking status: Former Games developer  . Smokeless tobacco: Former Neurosurgeon  . Alcohol use No  Allergies   Prednazoline; Prednisone; and Singulair [montelukast sodium]   Review of Systems Review of Systems + mild sore throat + cough No pleuritic pain ? wheezing + nasal congestion + post-nasal drainage No sinus pain/pressure No itchy/red eyes No earache No hemoptysis No SOB No fever, + chills No nausea No vomiting No abdominal pain No diarrhea No urinary symptoms No skin rash + fatigue No myalgias + headache Used OTC meds without relief   Physical Exam Triage Vital Signs ED Triage Vitals  Enc Vitals Group     BP 10/23/16 1615 154/78     Pulse Rate 10/23/16 1615 98     Resp --      Temp 10/23/16 1615 98 F (36.7 C)      Temp Source 10/23/16 1615 Oral     SpO2 10/23/16 1615 97 %     Weight 10/23/16 1615 194 lb (88 kg)     Height --      Head Circumference --      Peak Flow --      Pain Score 10/23/16 1617 0     Pain Loc --      Pain Edu? --      Excl. in GC? --    No data found.   Updated Vital Signs BP 154/78 (BP Location: Left Arm)   Pulse 98   Temp 98 F (36.7 C) (Oral)   Wt 194 lb (88 kg)   SpO2 97%   BMI 35.48 kg/m   Visual Acuity Right Eye Distance:   Left Eye Distance:   Bilateral Distance:    Right Eye Near:   Left Eye Near:    Bilateral Near:     Physical Exam Nursing notes and Vital Signs reviewed. Appearance:  Patient appears stated age, and in no acute distress Eyes:  Pupils are equal, round, and reactive to light and accomodation.  Extraocular movement is intact.  Conjunctivae are not inflamed  Ears:  Canals normal.  Tympanic membranes normal.  Nose:  Congested turbinates.  No sinus tenderness.    Pharynx:  Normal Neck:  Supple.  Tender enlarged posterior/lateral nodes are palpated bilaterally  Lungs:  Clear to auscultation.  Breath sounds are equal.  Moving air well. Heart:  Regular rate and rhythm without murmurs, rubs, or gallops.  Abdomen:  Nontender without masses or hepatosplenomegaly.  Bowel sounds are present.  No CVA or flank tenderness.  Extremities:  No edema.  Skin:  No rash present.    UC Treatments / Results  Labs (all labs ordered are listed, but only abnormal results are displayed) Labs Reviewed - No data to display  EKG  EKG Interpretation None       Radiology No results found.  Procedures Procedures (including critical care time)  Medications Ordered in UC Medications - No data to display   Initial Impression / Assessment and Plan / UC Course  I have reviewed the triage vital signs and the nursing notes.  Pertinent labs & imaging results that were available during my care of the patient were reviewed by me and considered in my  medical decision making (see chart for details).  Clinical Course  Patient is likely immunosuppressed as a result of Remicade and Plaquenil. Begin empiric doxycycline 100mg  BID Take plain guaifenesin (1200mg  extended release tabs such as Mucinex) twice daily, with plenty of water, for cough and congestion.  May add Pseudoephedrine (30mg , one or two every 4 to 6 hours) for sinus congestion.  Get adequate rest.  May use Afrin nasal spray (or generic oxymetazoline) twice daily for about 5 days and then discontinue.  Also recommend using saline nasal spray several times daily and saline nasal irrigation (AYR is a common brand).  Use Flonase nasal spray each morning after using Afrin nasal spray and saline nasal irrigation. Try warm salt water gargles for sore throat.  Stop all antihistamines for now, and other non-prescription cough/cold preparations. Continue inhalers as prescribed. May take Delsym Cough Suppressant at bedtime for nighttime cough.    Follow-up with family doctor if not improving about10 days.      Final Clinical Impressions(s) / UC Diagnoses   Final diagnoses:  Viral URI with cough    New Prescriptions New Prescriptions   DOXYCYCLINE (VIBRAMYCIN) 100 MG CAPSULE    Take 1 capsule (100 mg total) by mouth 2 (two) times daily. Take with food.     Lattie Haw, MD 11/04/16 1045

## 2016-10-23 NOTE — ED Triage Notes (Signed)
Pt c/o 2 days of sinus pain, nasal congestion, runny nose. Taken generic day/night cold and sinus medication.

## 2016-10-23 NOTE — Discharge Instructions (Signed)
Take plain guaifenesin (1200mg  extended release tabs such as Mucinex) twice daily, with plenty of water, for cough and congestion.  May add Pseudoephedrine (30mg , one or two every 4 to 6 hours) for sinus congestion.  Get adequate rest.   May use Afrin nasal spray (or generic oxymetazoline) twice daily for about 5 days and then discontinue.  Also recommend using saline nasal spray several times daily and saline nasal irrigation (AYR is a common brand).  Use Flonase nasal spray each morning after using Afrin nasal spray and saline nasal irrigation. Try warm salt water gargles for sore throat.  Stop all antihistamines for now, and other non-prescription cough/cold preparations. Continue inhalers as prescribed. May take Delsym Cough Suppressant at bedtime for nighttime cough.    Follow-up with family doctor if not improving about10 days.

## 2016-11-19 ENCOUNTER — Other Ambulatory Visit: Payer: Self-pay | Admitting: Physician Assistant

## 2016-12-01 ENCOUNTER — Ambulatory Visit (INDEPENDENT_AMBULATORY_CARE_PROVIDER_SITE_OTHER): Payer: PRIVATE HEALTH INSURANCE | Admitting: Physician Assistant

## 2016-12-01 VITALS — BP 126/74 | HR 84 | Temp 98.0°F

## 2016-12-01 DIAGNOSIS — Z111 Encounter for screening for respiratory tuberculosis: Secondary | ICD-10-CM | POA: Diagnosis not present

## 2016-12-01 DIAGNOSIS — Z23 Encounter for immunization: Secondary | ICD-10-CM | POA: Diagnosis not present

## 2016-12-01 NOTE — Progress Notes (Signed)
Patient came into clinic today for flu vaccination and PPD test placement. Patient reports she needs the PPD test done yearly for her job. She has never had a positive PPD test. Patient tolerated injection of flu immunization in right deltoid well, with no immediate complications. Patient tolerated PPD placement in right forearm well, no immediate complications. Advised to contact our office with any questions/concerns and scheduled her PPD read for 2 days.

## 2016-12-03 ENCOUNTER — Ambulatory Visit (INDEPENDENT_AMBULATORY_CARE_PROVIDER_SITE_OTHER): Payer: PRIVATE HEALTH INSURANCE | Admitting: Physician Assistant

## 2016-12-03 DIAGNOSIS — Z111 Encounter for screening for respiratory tuberculosis: Secondary | ICD-10-CM

## 2016-12-03 LAB — TB SKIN TEST: TB SKIN TEST: NEGATIVE

## 2016-12-03 NOTE — Progress Notes (Signed)
Pt here to have ppd test read. There was no induration, or swelling. Measurement was less than <5 mm.  Letter given for pt to give to employer.April Norman [place]

## 2016-12-20 ENCOUNTER — Emergency Department (INDEPENDENT_AMBULATORY_CARE_PROVIDER_SITE_OTHER)
Admission: EM | Admit: 2016-12-20 | Discharge: 2016-12-20 | Disposition: A | Discharge status: Home / Self Care | Payer: PRIVATE HEALTH INSURANCE | Source: Home / Self Care | Attending: Family Medicine | Admitting: Family Medicine

## 2016-12-20 ENCOUNTER — Encounter: Payer: Self-pay | Admitting: Emergency Medicine

## 2016-12-20 DIAGNOSIS — R0981 Nasal congestion: Secondary | ICD-10-CM

## 2016-12-20 MED ORDER — AZITHROMYCIN 250 MG PO TABS: 250.0000 mg | ORAL_TABLET | Freq: Every day | ORAL | 0 refills | Status: DC

## 2016-12-20 NOTE — ED Triage Notes (Signed)
Patient started with sinus congestion/pressure yesterday; because of RA she has been told to seek care early in process.Took tylenol/sinus at 2:30pm

## 2016-12-20 NOTE — ED Provider Notes (Signed)
CSN: [id number]     Arrival date & time 12/20/16  1721 History   First MD Initiated Contact with Patient 12/20/16 1804     Chief Complaint  Patient presents with  . Nasal Congestion   (Consider location/radiation/quality/duration/timing/severity/associated sxs/prior Treatment) HPI  April Norman is a 60 y.o. female presenting to UC with c/o sudden onset sinus congestion and pressure that started yesterday.  She has a hx of RA and has been told to seek medical attention early in process to help prevent infection from worsening. Denies fever, chills, n/v/d, or cough.  Denies sick contacts or recent travel. Pt does not feel like it is the flu.  She last took Tylenol sinus at 2:30PM today.     Past Medical History:  Diagnosis Date  . Asthma   . Rheumatoid arthritis (HCC)   . Vertigo, labyrinthine    Past Surgical History:  Procedure Laterality Date  . ABDOMINAL HYSTERECTOMY    . CHOLECYSTECTOMY     Family History  Problem Relation Age of Onset  . Heart disease Father   . Diabetes Father   . Stroke Father   . Diabetes Brother   . Stomach cancer Paternal Grandmother    Social History  Substance Use Topics  . Smoking status: Former Games developer  . Smokeless tobacco: Former Neurosurgeon  . Alcohol use No   OB History    No data available     Review of Systems  Constitutional: Negative for chills and fever.  HENT: Positive for congestion, postnasal drip, rhinorrhea and sinus pressure. Negative for ear pain, sore throat, trouble swallowing and voice change.   Respiratory: Negative for cough and shortness of breath.   Cardiovascular: Negative for chest pain and palpitations.  Gastrointestinal: Negative for abdominal pain, diarrhea, nausea and vomiting.  Musculoskeletal: Negative for arthralgias, back pain and myalgias.  Skin: Negative for rash.    Allergies  Prednazoline; Prednisone; and Singulair [montelukast sodium]  Home Medications   Prior to Admission medications   Medication  Sig Start Date End Date Taking? Authorizing Provider  albuterol (PROVENTIL HFA;VENTOLIN HFA) 108 (90 BASE) MCG/ACT inhaler Inhale 2 puffs into the lungs every 6 (six) hours as needed for wheezing or shortness of breath. 10/05/14   Jade L Breeback, PA-C  azithromycin (ZITHROMAX) 250 MG tablet Take 1 tablet (250 mg total) by mouth daily. Take first 2 tablets together, then 1 every day until finished. 12/20/16   Junius Finner, PA-C  Calcium-Magnesium-Vitamin D (CALCIUM 500 PO) Take by mouth.    Historical Provider, MD  hydroxychloroquine (PLAQUENIL) 200 MG tablet Take 1 tablet (200 mg total) by mouth 2 (two) times daily. 06/21/14   Jade L Breeback, PA-C  InFLIXimab (REMICADE IV) Inject into the vein.    Historical Provider, MD  inFLIXimab in sodium chloride 0.9 % Inject into the vein every 6 (six) weeks.    Historical Provider, MD  mometasone (ASMANEX 60 METERED DOSES) 220 MCG/INH inhaler INHALE 2 PUFFS INTO THE LUNGS DAILY. Due for follow up appointment. 09/26/15   Jade L Breeback, PA-C  sulfaSALAzine (AZULFIDINE) 500 MG tablet Take 1 tablet (500 mg total) by mouth 2 (two) times daily. 07/06/14   Jomarie Longs, PA-C   Meds Ordered and Administered this Visit  Medications - No data to display  BP 136/79 (BP Location: Left Arm)   Pulse 79   Temp 98.4 F (36.9 C) (Oral)   Resp 16   Ht 5\' 1"  (1.549 m)   Wt 186 lb (84.4 kg)  SpO2 98%   BMI 35.14 kg/m  No data found.   Physical Exam  Constitutional: She is oriented to person, place, and time. She appears well-developed and well-nourished. No distress.  HENT:  Head: Normocephalic and atraumatic.  Right Ear: Tympanic membrane normal.  Left Ear: Tympanic membrane normal.  Nose: Mucosal edema present. Right sinus exhibits no maxillary sinus tenderness and no frontal sinus tenderness. Left sinus exhibits no maxillary sinus tenderness and no frontal sinus tenderness.  Mouth/Throat: Uvula is midline, oropharynx is clear and moist and mucous  membranes are normal.  Eyes: EOM are normal.  Neck: Normal range of motion. Neck supple.  Cardiovascular: Normal rate and regular rhythm.   Pulmonary/Chest: Effort normal and breath sounds normal. No stridor. No respiratory distress. She has no wheezes. She has no rales.  Musculoskeletal: Normal range of motion.  Lymphadenopathy:    She has no cervical adenopathy.  Neurological: She is alert and oriented to person, place, and time.  Skin: Skin is warm and dry. She is not diaphoretic.  Psychiatric: She has a normal mood and affect. Her behavior is normal.  Nursing note and vitals reviewed.   Urgent Care Course   Clinical Course     Procedures (including critical care time)  Labs Review Labs Reviewed - No data to display  Imaging Review No results found.   MDM   1. Nasal congestion    Pt presenting to UC with c/o 1 day of nasal congestion w/o other symptoms. She has a hx of RA and is on immunosuppressive therapy, was advised to be evaluated early in illness process.   Although pt is on immunosuppressive tx, no evidence of bacterial infection at this time.  Doubt influenza or pneumonia.  Will provide prescription to hold for azithromycin in case symptoms worsen. Strongly encouraged symptomatic treatment with sinus rinses first.   F/u with PCP next week as needed.    Junius Finner, PA-C 12/20/16 1919

## 2016-12-20 NOTE — Discharge Instructions (Signed)
°  Your symptoms are likely due to a virus such as the common cold, however, if you developing worsening chest congestion with shortness of breath, persistent fever for 3 days, or symptoms not improving in 4-5 days, you may fill the antibiotic (azithromycin).  If you do fill the antibiotic,  please take antibiotics as prescribed and be sure to complete entire course even if you start to feel better to ensure infection does not come back. ° °

## 2017-05-11 ENCOUNTER — Ambulatory Visit (INDEPENDENT_AMBULATORY_CARE_PROVIDER_SITE_OTHER): Payer: PRIVATE HEALTH INSURANCE | Admitting: Podiatry

## 2017-05-11 ENCOUNTER — Encounter: Payer: Self-pay | Admitting: Podiatry

## 2017-05-11 VITALS — BP 160/91 | HR 93 | Ht 62.0 in | Wt 197.0 lb

## 2017-05-11 DIAGNOSIS — M79675 Pain in left toe(s): Secondary | ICD-10-CM | POA: Diagnosis not present

## 2017-05-11 DIAGNOSIS — L600 Ingrowing nail: Secondary | ICD-10-CM | POA: Diagnosis not present

## 2017-05-11 DIAGNOSIS — M79674 Pain in right toe(s): Secondary | ICD-10-CM | POA: Diagnosis not present

## 2017-05-11 NOTE — Patient Instructions (Signed)
Seen for painful ingrown nail on both great toes both borders. Surgical option discussed. Schedule for Exc nail and matrix both border left great toe at [facility] office.

## 2017-05-11 NOTE — Progress Notes (Signed)
SUBJECTIVE: 61 y.o. year old female presents complaining of painful ingrown nail. Has had deformity for many years on both great toes.  She has a family member with the same nail deformity.   REVIEW OF SYSTEMS: A comprehensive review of systems was negative except for: Takes infusion therapy for Rheumatoid and being treated for stomach ulcer.  OBJECTIVE: DERMATOLOGIC EXAMINATION: Tube form cylinder shape nail growth both borders and  Both great toe nails with pain. No active infection or open skin lesions.  VASCULAR EXAMINATION OF LOWER LIMBS: All pedal pulses are palpable with normal pulsation.  Capillary Filling times within 3 seconds in all digits.  No acute edema or erythema noted. Temperature gradient from tibial crest to dorsum of foot is within normal bilateral.  NEUROLOGIC EXAMINATION OF THE LOWER LIMBS: All epicritic and tactile sensations grossly intact. Sharp and Dull discriminatory sensations at the plantar ball of hallux is intact bilateral.   MUSCULOSKELETAL EXAMINATION: No gross deformities.  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 Pain on both great toes.  PLAN: Reviewed clinical findings and available treatment options. Patient will schedule for nail surgery to remove ingrown part of nail border left side first. Informed of excisional procedure to prevent recurring problem.

## 2017-05-22 ENCOUNTER — Ambulatory Visit (INDEPENDENT_AMBULATORY_CARE_PROVIDER_SITE_OTHER): Payer: PRIVATE HEALTH INSURANCE | Admitting: Podiatry

## 2017-05-22 ENCOUNTER — Encounter: Payer: Self-pay | Admitting: Podiatry

## 2017-05-22 ENCOUNTER — Ambulatory Visit: Payer: PRIVATE HEALTH INSURANCE | Admitting: Podiatry

## 2017-05-22 DIAGNOSIS — L600 Ingrowing nail: Secondary | ICD-10-CM | POA: Diagnosis not present

## 2017-05-22 DIAGNOSIS — M79675 Pain in left toe(s): Secondary | ICD-10-CM

## 2017-05-22 DIAGNOSIS — M79674 Pain in right toe(s): Secondary | ICD-10-CM

## 2017-05-22 MED ORDER — CEPHALEXIN 750 MG PO CAPS: 750.0000 mg | ORAL_CAPSULE | Freq: Two times a day (BID) | ORAL | 0 refills | Status: DC

## 2017-05-22 NOTE — Patient Instructions (Signed)
Ingrown nail surgery was done on left great toe. Keep the dressing clean and dry. Elevated the affected limb rest of the day. Take medication as prescribed. Will do dressing change this coming Monday. Call the office if the area gets feverish with increased redness and drainage.

## 2017-05-22 NOTE — Progress Notes (Signed)
Patient presents for scheduled nail surgery on left great toe.  Affected toe was anesthetized with total 60ml mixture of 50/50 0.5% Marcaine plain and 1% Xylocaine plain. Affected both nail borders were reflected with a nail elevator and excised with nail nipper. Corresponding proximal nail matrix tissue was also excised.  The area was flushed with copious amount of normal saline and steri strip compression dressing applied. The wound was dressed with Amerigel ointment dressing. Home care instructions and and antibiotics prescribed. Return in 3 days for dressing change.

## 2017-05-25 ENCOUNTER — Encounter: Payer: Self-pay | Admitting: Podiatry

## 2017-05-25 ENCOUNTER — Ambulatory Visit (INDEPENDENT_AMBULATORY_CARE_PROVIDER_SITE_OTHER): Payer: PRIVATE HEALTH INSURANCE | Admitting: Podiatry

## 2017-05-25 DIAGNOSIS — Z9889 Other specified postprocedural states: Secondary | ICD-10-CM

## 2017-05-25 NOTE — Patient Instructions (Signed)
4 days post op following right great toe nail surgery. Positive of some ecchymosis in proximal skin folder. No drainage noted. Continue with antibiotics and return in one week.

## 2017-05-25 NOTE — Progress Notes (Signed)
1 week p  ost op following excision nail and matrix left great toe both borders. The left great toe has been very sore. Had much blood in surgery dressing. Surgical wound is clean without drainage. Taking Keflex 750 mg bid. Dressing change done. Noted of proximal ecchymosis in nail bed. Home care instruction given. Return in one week.

## 2017-06-01 ENCOUNTER — Ambulatory Visit (INDEPENDENT_AMBULATORY_CARE_PROVIDER_SITE_OTHER): Payer: PRIVATE HEALTH INSURANCE | Admitting: Podiatry

## 2017-06-01 ENCOUNTER — Encounter: Payer: PRIVATE HEALTH INSURANCE | Admitting: Podiatry

## 2017-06-01 DIAGNOSIS — Z9889 Other specified postprocedural states: Secondary | ICD-10-CM

## 2017-06-02 ENCOUNTER — Encounter: Payer: Self-pay | Admitting: Podiatry

## 2017-06-02 ENCOUNTER — Encounter: Payer: PRIVATE HEALTH INSURANCE | Admitting: Podiatry

## 2017-06-02 NOTE — Patient Instructions (Signed)
Normal wound healing. Return as needed.

## 2017-06-02 NOTE — Progress Notes (Signed)
2 weeks post op following the nail surgery left great toe. Surgical sites healing well without drainage or opening. Continue daily dressing dressing change till the tenderness stop/

## 2017-07-03 ENCOUNTER — Telehealth: Payer: Self-pay | Admitting: Sports Medicine

## 2017-07-03 ENCOUNTER — Ambulatory Visit (INDEPENDENT_AMBULATORY_CARE_PROVIDER_SITE_OTHER): Payer: PRIVATE HEALTH INSURANCE | Admitting: Family Medicine

## 2017-07-03 ENCOUNTER — Encounter: Payer: Self-pay | Admitting: Family Medicine

## 2017-07-03 VITALS — BP 142/59 | HR 90 | Wt 198.0 lb

## 2017-07-03 DIAGNOSIS — M8580 Other specified disorders of bone density and structure, unspecified site: Secondary | ICD-10-CM

## 2017-07-03 DIAGNOSIS — E559 Vitamin D deficiency, unspecified: Secondary | ICD-10-CM | POA: Insufficient documentation

## 2017-07-03 DIAGNOSIS — R21 Rash and other nonspecific skin eruption: Secondary | ICD-10-CM

## 2017-07-03 DIAGNOSIS — R202 Paresthesia of skin: Secondary | ICD-10-CM | POA: Diagnosis not present

## 2017-07-03 DIAGNOSIS — M0579 Rheumatoid arthritis with rheumatoid factor of multiple sites without organ or systems involvement: Secondary | ICD-10-CM | POA: Diagnosis not present

## 2017-07-03 LAB — CBC
HCT: 42.5 % (ref 35.0–45.0)
HEMOGLOBIN: 14.1 g/dL (ref 11.7–15.5)
MCH: 29.9 pg (ref 27.0–33.0)
MCHC: 33.2 g/dL (ref 32.0–36.0)
MCV: 90.2 fL (ref 80.0–100.0)
MPV: 9.1 fL (ref 7.5–12.5)
PLATELETS: 396 10*3/uL (ref 140–400)
RBC: 4.71 MIL/uL (ref 3.80–5.10)
RDW: 12.9 % (ref 11.0–15.0)
WBC: 8 10*3/uL (ref 3.8–10.8)

## 2017-07-03 LAB — TSH: TSH: 1.27 mIU/L

## 2017-07-03 MED ORDER — EUCERIN EX CREA: TOPICAL_CREAM | CUTANEOUS | 0 refills | Status: DC | PRN

## 2017-07-03 MED ORDER — TRIAMCINOLONE ACETONIDE 0.5 % EX CREA: 1.0000 "application " | TOPICAL_CREAM | Freq: Two times a day (BID) | CUTANEOUS | 3 refills | Status: DC

## 2017-07-03 MED ORDER — GABAPENTIN 300 MG PO CAPS: ORAL_CAPSULE | ORAL | 3 refills | Status: DC

## 2017-07-03 NOTE — Telephone Encounter (Signed)
Pt advised.

## 2017-07-03 NOTE — Telephone Encounter (Signed)
Pt requesting different Rx, or if there is no other option then please advise.

## 2017-07-03 NOTE — Telephone Encounter (Signed)
Remind her that her Asmanex that she's BEEN taking is a steroid.  Shes not allergic to "steroids."

## 2017-07-03 NOTE — Progress Notes (Signed)
April Norman is a 61 y.o. female who presents to [facility] today for arm pain. Patient notes a several day history of left-sided arm pain skin redness and tenderness. This is also associated with some numbness and tingling to a patch of skin at the lateral elbow. She denies any pain radiating down to her arms. She notes this occurs at the left elbow and very mildly at the right. At worst the symptoms are as bad as 10 out of 10. She has tried ice which have helped a little. Additionally she's tried some over-the-counter cream which has helped a little as well. She denies any fevers or chills nausea vomiting or diarrhea. Her past medical history significant for rheumatoid arthritis. She denies any changes of medications however. She denies any neck pain or radiating pain.   Past Medical History:  Diagnosis Date  . Asthma   . Rheumatoid arthritis (HCC)   . Vertigo, labyrinthine    Past Surgical History:  Procedure Laterality Date  . ABDOMINAL HYSTERECTOMY    . CHOLECYSTECTOMY     Social History  Substance Use Topics  . Smoking status: Former Games developer  . Smokeless tobacco: Former Neurosurgeon  . Alcohol use No     ROS:  As above   Medications: Current Outpatient Prescriptions  Medication Sig Dispense Refill  . albuterol (PROVENTIL HFA;VENTOLIN HFA) 108 (90 BASE) MCG/ACT inhaler Inhale 2 puffs into the lungs every 6 (six) hours as needed for wheezing or shortness of breath. 1 Inhaler 2  . Calcium-Magnesium-Vitamin D (CALCIUM 500 PO) Take by mouth.    . hydroxychloroquine (PLAQUENIL) 200 MG tablet Take 1 tablet (200 mg total) by mouth 2 (two) times daily. 60 tablet 1  . InFLIXimab (REMICADE IV) Inject into the vein.    Marland Kitchen inFLIXimab in sodium chloride 0.9 % Inject into the vein every 6 (six) weeks.    . mometasone (ASMANEX 60 METERED DOSES) 220 MCG/INH inhaler INHALE 2 PUFFS INTO THE LUNGS DAILY. Due for follow up appointment. 1 Inhaler 0  .  pantoprazole (PROTONIX) 40 MG tablet Take 40 mg by mouth daily.    . sucralfate (CARAFATE) 1 GM/10ML suspension Take 1 g by mouth daily as needed.    . sulfaSALAzine (AZULFIDINE) 500 MG tablet Take 1 tablet (500 mg total) by mouth 2 (two) times daily. 60 tablet 0  . gabapentin (NEURONTIN) 300 MG capsule One tab PO qHS for a week, then BID for a week, then TID. May double weekly to a max of 3,600mg /day 180 capsule 3  . triamcinolone cream (KENALOG) 0.5 % Apply 1 application topically 2 (two) times daily. To affected areas. 30 g 3   No current facility-administered medications for this visit.    Allergies  Allergen Reactions  . Prednazoline Swelling  . Prednisone     Swelling of face, neck, esphagus  . Singulair [Montelukast Sodium]     diarrhea     Exam:  BP (!) 142/59   Pulse 90   Wt 198 lb (89.8 kg)   SpO2 98%   BMI 36.21 kg/m  General: Well Developed, well nourished, and in no acute distress.  Neuro/Psych: Alert and oriented x3, extra-ocular muscles intact, able to move all 4 extremities, sensation grossly intact. Skin: Normal-appearing skin with the exception of mildly erythematous and excoriated skin at the left lateral elbow. No other rashes or vesicles or induration present. Respiratory: Not using accessory muscles, speaking in full sentences, trachea midline.  Cardiovascular: Pulses palpable, no extremity  edema. Abdomen: Does not appear distended. MSK:  C-spine is nontender to midline with normal neck motion. Negative Spurling's test bilaterally. Upper extremity strength reflexes and sensation are intact.      No results found for this or any previous visit (from the past 48 hour(s)). No results found.    Assessment and Plan: 61 y.o. female with left elbow pain and potential paresthesias. The symptoms are a bit odd. The location of pain is not consistent with any dermatomes. It's possible that she has a transient rash that's not incredibly visible today. However  the skin is a bit irritated with erythema and excoriation. It's possible she has contact dermatitis or something else that's partially treated with over-the-counter medications. Plan to treat with triamcinolone cream. Additionally we will use gabapentin as needed for symptom control. We'll do a little workup with some basic labs today. We'll consider a nerve conduction study in the future if not improving.   Orders Placed This Encounter  Procedures  . CBC  . COMPLETE METABOLIC PANEL WITH GFR  . Lipid Panel w/reflex Direct LDL  . TSH  . VITAMIN D 25 Hydroxy (Vit-D Deficiency, Fractures)   I spent 40 minutes with this patient, greater than 50% was face-to-face time counseling regarding the above diagnosis.   Discussed warning signs or symptoms. Please see discharge instructions. Patient expresses understanding.

## 2017-07-03 NOTE — Patient Instructions (Addendum)
Thank you for coming in today. Apply triamcinolone cream to the forearm twice daily until looks normal.  Also apply the cream to the skin behind the ears.  Use also over the counter lamisil cream to the ears.   Take gabapentin up to 3x daily as needed for burning tingling pain.    Recheck in 4 weeks especially if not better.   Get labs today.   Recheck sooner if needed.

## 2017-07-03 NOTE — Telephone Encounter (Signed)
It looks like she is "allergic" to oral steroids, and because no one is ever actually allergic to steroids, it was likely either the carrier or preservatives and the prednisone. Topical steroids are okay.  In addition it sounds like she had some swelling and redness of the face when she last took prednisone, this is also a normal occurrence with prednisone.

## 2017-07-03 NOTE — Telephone Encounter (Signed)
Pt was seen in clinic today by Dr. Denyse Amass and given Rx for triamcinolone cream (KENALOG) 0.5 %. Pt is allergic to steroids, and the pharmacy advised her not to use the new Rx cream. Routing to Provider in office today for review and for new Rx to be sent.

## 2017-07-03 NOTE — Telephone Encounter (Signed)
Pt is refusing to take anything that is in the steroid family. Request something different.

## 2017-07-03 NOTE — Telephone Encounter (Signed)
We are going to try Eucerin cream. No guarantee that this will work.

## 2017-07-04 ENCOUNTER — Encounter: Payer: Self-pay | Admitting: Physician Assistant

## 2017-07-04 LAB — LIPID PANEL W/REFLEX DIRECT LDL
CHOL/HDL RATIO: 2.7 ratio (ref <5.0)
Cholesterol: 290 mg/dL — ABNORMAL HIGH (ref <200)
HDL: 107 mg/dL (ref >50)
LDL-Cholesterol: 167 mg/dL — ABNORMAL HIGH
Non-HDL Cholesterol (Calc): 183 mg/dL — ABNORMAL HIGH (ref <130)
Triglycerides: 64 mg/dL (ref <150)

## 2017-07-04 LAB — COMPLETE METABOLIC PANEL WITH GFR
ALK PHOS: 76 U/L (ref 33–130)
ALT: 18 U/L (ref 6–29)
AST: 22 U/L (ref 10–35)
Albumin: 4.4 g/dL (ref 3.6–5.1)
BUN: 14 mg/dL (ref 7–25)
CHLORIDE: 102 mmol/L (ref 98–110)
CO2: 23 mmol/L (ref 20–31)
Calcium: 9.6 mg/dL (ref 8.6–10.4)
Creat: 0.77 mg/dL (ref 0.50–1.05)
GFR, EST NON AFRICAN AMERICAN: 85 mL/min (ref >=60)
GFR, Est African American: >89 mL/min (ref >=60)
GLUCOSE: 92 mg/dL (ref 65–99)
POTASSIUM: 4.3 mmol/L (ref 3.5–5.3)
SODIUM: 140 mmol/L (ref 135–146)
Total Bilirubin: 0.5 mg/dL (ref 0.2–1.2)
Total Protein: 7.2 g/dL (ref 6.1–8.1)

## 2017-07-04 LAB — VITAMIN D 25 HYDROXY (VIT D DEFICIENCY, FRACTURES): Vit D, 25-Hydroxy: 28 ng/mL — ABNORMAL LOW (ref 30–100)

## 2017-07-13 ENCOUNTER — Ambulatory Visit (INDEPENDENT_AMBULATORY_CARE_PROVIDER_SITE_OTHER): Payer: PRIVATE HEALTH INSURANCE | Admitting: Osteopathic Medicine

## 2017-07-13 ENCOUNTER — Encounter: Payer: Self-pay | Admitting: Osteopathic Medicine

## 2017-07-13 VITALS — BP 119/74 | HR 74 | Ht 62.0 in | Wt 196.0 lb

## 2017-07-13 DIAGNOSIS — Z0189 Encounter for other specified special examinations: Secondary | ICD-10-CM

## 2017-07-13 DIAGNOSIS — E7800 Pure hypercholesterolemia, unspecified: Secondary | ICD-10-CM

## 2017-07-13 DIAGNOSIS — Z0000 Encounter for general adult medical examination without abnormal findings: Secondary | ICD-10-CM | POA: Diagnosis not present

## 2017-07-13 DIAGNOSIS — Z6835 Body mass index (BMI) 35.0-35.9, adult: Secondary | ICD-10-CM | POA: Diagnosis not present

## 2017-07-13 NOTE — Progress Notes (Signed)
HPI: [facility] is a 61 y.o. female  who [facility] today, 07/13/17,  for chief complaint of:  Chief Complaint  Patient presents with  . Annual Exam    Patient here for annual physical / wellness exam.  See preventive care reviewed as below.  Recent labs reviewed in detail with the patient.   Additional concerns today include:  None.   Past medical, surgical, social and family history reviewed: Patient Active Problem List   Diagnosis Date Noted  . Vitamin D deficiency 07/03/2017  . Posterior tibial tendonitis 12/31/2015  . Bilateral foot pain 10/15/2015  . Nodule of external ear 10/12/2015  . Seborrheic dermatitis 10/12/2015  . Asthma, chronic 01/02/2015  . Hyperplastic colon polyp 01/02/2015  . Osteopenia 08/01/2014  . Rheumatoid arthritis (HCC) 06/05/2014  . Asthma 06/05/2014   Past Surgical History:  Procedure Laterality Date  . ABDOMINAL HYSTERECTOMY    . CHOLECYSTECTOMY     Social History  Substance Use Topics  . Smoking status: Former Games developer  . Smokeless tobacco: Former Neurosurgeon  . Alcohol use No   Family History  Problem Relation Age of Onset  . Heart disease Father   . Diabetes Father   . Stroke Father   . Diabetes Brother   . Stomach cancer Paternal Grandmother      Current medication list and allergy/intolerance information reviewed:   Current Outpatient Prescriptions  Medication Sig Dispense Refill  . albuterol (PROVENTIL HFA;VENTOLIN HFA) 108 (90 BASE) MCG/ACT inhaler Inhale 2 puffs into the lungs every 6 (six) hours as needed for wheezing or shortness of breath. 1 Inhaler 2  . Calcium-Magnesium-Vitamin D (CALCIUM 500 PO) Take by mouth.    . gabapentin (NEURONTIN) 300 MG capsule One tab PO qHS for a week, then BID for a week, then TID. May double weekly to a max of 3,600mg /day 180 capsule 3  . hydroxychloroquine (PLAQUENIL) 200 MG tablet Take 1 tablet (200 mg total) by mouth 2 (two) times daily. 60  tablet 1  . InFLIXimab (REMICADE IV) Inject into the vein.    Marland Kitchen inFLIXimab in sodium chloride 0.9 % Inject into the vein every 6 (six) weeks.    . mometasone (ASMANEX 60 METERED DOSES) 220 MCG/INH inhaler INHALE 2 PUFFS INTO THE LUNGS DAILY. Due for follow up appointment. 1 Inhaler 0  . pantoprazole (PROTONIX) 40 MG tablet Take 40 mg by mouth daily.    . Skin Protectants, Misc. (EUCERIN) cream Apply topically as needed for dry skin. 454 g 0  . sucralfate (CARAFATE) 1 GM/10ML suspension Take 1 g by mouth daily as needed.    . sulfaSALAzine (AZULFIDINE) 500 MG tablet Take 1 tablet (500 mg total) by mouth 2 (two) times daily. 60 tablet 0  . triamcinolone cream (KENALOG) 0.5 % Apply 1 application topically 2 (two) times daily. To affected areas. 30 g 3   No current facility-administered medications for this visit.    Allergies  Allergen Reactions  . Prednazoline Swelling  . Prednisone     Swelling of face, neck, esphagus  . Singulair [Montelukast Sodium]     diarrhea      Review of Systems:  Constitutional:  No  fever, no chills, No recent illness, No unintentional weight changes. No significant fatigue.    HEENT: No  headache, no vision change, no hearing change, No sore throat, No  sinus pressure  Cardiac: No  chest pain, No  pressure, No palpitations,  Respiratory:  No  shortness of  breath. No  Cough  Gastrointestinal: No  abdominal pain, No  nausea, No  vomiting,  No  blood in stool, No  diarrhea, No  constipation   Musculoskeletal: No new myalgia/arthralgia  Skin: No  Rash, No other wounds/concerning lesions  Hem/Onc: No  easy bruising/bleeding,  Endocrine: No cold intolerance,  No heat intolerance. No polyuria/polydipsia/polyphagia   Neurologic: No  weakness, No  dizziness  Psychiatric: No  concerns with depression, No  concerns with anxiety, No sleep problems, No mood problems  Exam:  BP 119/74   Pulse 74   Ht 5\' 2"  (1.575 m)   Wt 196 lb (88.9 kg)   BMI 35.85  kg/m   Constitutional: VS see above. General Appearance: alert, well-developed, well-nourished, NAD  Eyes: Normal lids and conjunctive, non-icteric sclera  Ears, Nose, Mouth, Throat: MMM, Normal external inspection ears/nares/mouth/lips/gums. TM normal bilaterally. Pharynx/tonsils no erythema, no exudate. Nasal mucosa normal.   Neck: No masses, trachea midline. No thyroid enlargement. No tenderness/mass appreciated. No lymphadenopathy  Respiratory: Normal respiratory effort. no wheeze, no rhonchi, no rales  Cardiovascular: S1/S2 normal, no murmur, no rub/gallop auscultated. RRR. No lower extremity edema.   Gastrointestinal: Nontender, no masses. No hepatomegaly, no splenomegaly. No hernia appreciated. Bowel sounds normal. Rectal exam deferred.   Musculoskeletal: Gait normal. No clubbing/cyanosis of digits.   Neurological: Normal balance/coordination. No tremor. No cranial nerve deficit on limited exam.   Skin: warm, dry, intact. No rash/ulcer.    Psychiatric: Normal judgment/insight. Normal mood and affect. Oriented x3.   Recent Results (from the past 2160 hour(s))  CBC     Status: None   Collection Time: 07/03/17 10:27 AM  Result Value Ref Range   WBC 8.0 3.8 - 10.8 K/uL   RBC 4.71 3.80 - 5.10 MIL/uL   Hemoglobin 14.1 11.7 - 15.5 g/dL   HCT 07/05/17 17.7 - 93.9 %   MCV 90.2 80.0 - 100.0 fL   MCH 29.9 27.0 - 33.0 pg   MCHC 33.2 32.0 - 36.0 g/dL   RDW 03.0 09.2 - 33.0 %   Platelets 396 140 - 400 K/uL   MPV 9.1 7.5 - 12.5 fL  COMPLETE METABOLIC PANEL WITH GFR     Status: None   Collection Time: 07/03/17 10:27 AM  Result Value Ref Range   Sodium 140 135 - 146 mmol/L   Potassium 4.3 3.5 - 5.3 mmol/L   Chloride 102 98 - 110 mmol/L   CO2 23 20 - 31 mmol/L   Glucose, Bld 92 65 - 99 mg/dL   BUN 14 7 - 25 mg/dL   Creat 07/05/17 2.26 - 3.33 mg/dL    Comment:   For patients > or = 59 years of age: The upper reference limit for Creatinine is approximately 13% higher for people  identified as African-American.      Total Bilirubin 0.5 0.2 - 1.2 mg/dL   Alkaline Phosphatase 76 33 - 130 U/L   AST 22 10 - 35 U/L   ALT 18 6 - 29 U/L   Total Protein 7.2 6.1 - 8.1 g/dL   Albumin 4.4 3.6 - 5.1 g/dL   Calcium 9.6 8.6 - 44 mg/dL   GFR, Est African American >89 >=60 mL/min   GFR, Est Non African American 85 >=60 mL/min  Lipid Panel w/reflex Direct LDL     Status: Abnormal   Collection Time: 07/03/17 10:27 AM  Result Value Ref Range   Cholesterol 290 (H) <200 mg/dL   Triglycerides 64 07/05/17 mg/dL  HDL 107 >50 mg/dL   Total CHOL/HDL Ratio 2.7 <5.0 Ratio   Non-HDL Cholesterol (Calc) 183 (H) <130 mg/dL    Comment:   For patients with diabetes plus 1 major ASCVD risk factor, treating to a non-HDL-C goal of <100 mg/dL (LDL-C of <16 mg/dL) is considered a therapeutic option.      LDL-Cholesterol 167 (H) mg/dL    Comment: Reference range: <100   Desirable range <100 mg/dL for primary prevention; <70 mg/dL for patients with CHD or diabetic patients with > or = 2 CHD risk factors.     The Martin-Hopkins calculation is a validated novel method that provides better accuracy than the Friedwald equation in the estimation of LDL-C, particularly when TG levels are 150-400 mg/dL and LDL-C levels are lower than 70 mg/dL. Reference:  Horald Pollen et al.  Comparison of a Novel Method vs the Lauralee Evener for Estimating Low-Density Lipoprotein Cholesterol Levels From the Standard Lipid Profile.  JAMA. 1096;045(40): 2061-2068.   For additional information, please refer to http://education.[facility].com/faq/FAQ164 (This link is being provided for informational/educational purposes only.)   TSH     Status: None   Collection Time: 07/03/17 10:27 AM  Result Value Ref Range   TSH 1.27 mIU/L    Comment:   Reference Range   > or = 20 Years  0.40-4.50   Pregnancy Range First trimester  0.26-2.66 Second trimester 0.55-2.73 Third trimester  0.43-2.91      VITAMIN D 25 Hydroxy (Vit-D Deficiency, Fractures)     Status: Abnormal   Collection Time: 07/03/17 10:27 AM  Result Value Ref Range   Vit D, 25-Hydroxy 28 (L) 30 - 100 ng/mL    Comment: Vitamin D Status           25-OH Vitamin D        Deficiency                <20 ng/mL        Insufficiency         20 - 29 ng/mL        Optimal             > or = 30 ng/mL   For 25-OH Vitamin D testing on patients on D2-supplementation and patients for whom quantitation of D2 and D3 fractions is required, the [facility] 25-OH VIT D, (D2,D3), LC/MS/MS is recommended: order code 98119 (patients > 2 yrs).       ASSESSMENT/PLAN:   Annual physical exam  Encounter for tobacco use screening - Plan: Nicotine/cotinine metabolites  BMI 35.0-35.9,adult - Plan: Amb ref to Medical Nutrition Therapy-MNT  Elevated LDL cholesterol level - ASCVD risk does not indicate statin benefit group, HDL also on the high side which is good. Consider recheck after diet/exercise changes     FEMALE PREVENTIVE CARE Updated 07/13/17   ANNUAL SCREENING/COUNSELING  Diet/Exercise - HEALTHY HABITS DISCUSSED TO DECREASE CV RISK History  Smoking Status  . Former Smoker  Smokeless Tobacco  . Former Neurosurgeon   History  Alcohol Use No  none   Depression screen PHQ 2/9 07/13/2017  Decreased Interest 0  Down, Depressed, Hopeless 0  PHQ - 2 Score 0    Domestic violence concerns - no  HTN SCREENING - SEE VITALS  SEXUAL HEALTH  Sexually active in the past year - Yes with female.  Need/want STI testing today? - no  Concerns about libido or pain with sex? - no  Plans for pregnancy? - hysterectomy   INFECTIOUS DISEASE SCREENING  HIV - does not need  GC/CT - does not need  HepC - DOB [birth year] - does not need  TB - does not need  DISEASE SCREENING  Lipid - does not need  DM2 - does not need  Osteoporosis - women age 56+ - does not need  CANCER SCREENING  Cervical - does not need  Breast -  needs  Lung - does not need  Colon - does not need - we need records   ADULT VACCINATION  Influenza - annual vaccine recommended  Td - booster every 10 years   Zoster - option at 71, yes at 60+   PCV13 - was not indicated  PPSV23 - was not indicated Immunization History  Administered Date(s) Administered  . Influenza,inj,Quad PF,36+ Mos 10/31/2014, 10/12/2015, 12/01/2016  . PPD Test 12/01/2016     OTHER  Fall - exercise and Vit D age 56+ - does not need  Consider ASA - age 72-59 - does not need     Visit summary with medication list and pertinent instructions was printed for patient to review. All questions at time of visit were answered - patient instructed to contact office with any additional concerns. ER/RTC precautions were reviewed with the patient. Follow-up plan: Return in about 1 year (around 07/13/2018) for ANNUAL PHYSICAL - sooner as directed by PCP for routine care or as needed for new/acute illness .

## 2017-07-14 LAB — NICOTINE/COTININE METABOLITES: COTININE: NEGATIVE

## 2017-07-15 ENCOUNTER — Other Ambulatory Visit: Payer: Self-pay | Admitting: Osteopathic Medicine

## 2017-07-15 DIAGNOSIS — Z6835 Body mass index (BMI) 35.0-35.9, adult: Secondary | ICD-10-CM

## 2017-07-15 DIAGNOSIS — E669 Obesity, unspecified: Secondary | ICD-10-CM

## 2017-07-31 ENCOUNTER — Ambulatory Visit: Payer: PRIVATE HEALTH INSURANCE | Admitting: Family Medicine

## 2018-01-15 ENCOUNTER — Encounter: Payer: Self-pay | Admitting: Sports Medicine

## 2018-01-15 ENCOUNTER — Ambulatory Visit (INDEPENDENT_AMBULATORY_CARE_PROVIDER_SITE_OTHER): Payer: PRIVATE HEALTH INSURANCE | Admitting: Sports Medicine

## 2018-01-15 DIAGNOSIS — L650 Telogen effluvium: Secondary | ICD-10-CM

## 2018-01-15 MED ORDER — MINOXIDIL 5 % EX FOAM: 1.0000 "application " | Freq: Two times a day (BID) | CUTANEOUS | 11 refills | Status: DC

## 2018-01-15 NOTE — Assessment & Plan Note (Signed)
Currently under significant emotional stress at work, timing is consistent for when the hair loss started. Hair loss has been since before Christmas. Several strands per day but no significant balding. Adding minoxidil, checking labs including CBC, CMP, ferritin, TSH. Return to see [place] in about a month to see how things are going. Certainly her rheumatoid arthritis treatments could also be a cause.

## 2018-01-15 NOTE — Progress Notes (Signed)
Subjective:    CC: Hair loss  HPI: This is a pleasant 62 year old female with a history of rheumatoid arthritis, since Christmas she is noted mild hair loss, first coming out in her comb, and then noted to be greater with her hairdresser.  She has not noted any localized areas of baldness.  She is on Remicade, Plaquenil, and sulfasalazine with her rheumatologist.  No changes in medications.  She has not had any major illnesses recently.  She does endorse severe stress with work that started right around before Christmas as well.  She works as a Sales executive, has had some Surveyor, quantity.  She is thinking about quitting her job.  I reviewed the past medical history, family history, social history, surgical history, and allergies today and no changes were needed.  Please see the problem list section below in epic for further details.  Past Medical History: Past Medical History:  Diagnosis Date  . Asthma   . Rheumatoid arthritis (HCC)   . Vertigo, labyrinthine    Past Surgical History: Past Surgical History:  Procedure Laterality Date  . ABDOMINAL HYSTERECTOMY    . CHOLECYSTECTOMY     Social History: Social History   Socioeconomic History  . Marital status: Married    Spouse name: None  . Number of children: None  . Years of education: None  . Highest education level: None  Social Needs  . Financial resource strain: None  . Food insecurity - worry: None  . Food insecurity - inability: None  . Transportation needs - medical: None  . Transportation needs - non-medical: None  Occupational History  . None  Tobacco Use  . Smoking status: Former Games developer  . Smokeless tobacco: Former Engineer, water and Sexual Activity  . Alcohol use: No  . Drug use: No  . Sexual activity: Yes  Other Topics Concern  . None  Social History Narrative  . None   Family History: Family History  Problem Relation Age of Onset  . Heart disease Father   . Diabetes Father   . Stroke  Father   . Diabetes Brother   . Stomach cancer Paternal Grandmother    Allergies: Allergies  Allergen Reactions  . Prednazoline Swelling  . Prednisone     Swelling of face, neck, esphagus  . Singulair [Montelukast Sodium]     diarrhea   Medications: See med rec.  Review of Systems: No fevers, chills, night sweats, weight loss, chest pain, or shortness of breath.   Objective:    General: Well Developed, well nourished, and in no acute distress.  Neuro: Alert and oriented x3, extra-ocular muscles intact, sensation grossly intact.  HEENT: Normocephalic, atraumatic, pupils equal round reactive to light, neck supple, no masses, no lymphadenopathy, thyroid nonpalpable.  Volume of hair is for the most part unremarkable, she is able to remove about 3 strands of hair when she runs her hands gently through it.  No visible areas of scarring on the scalp, no scalp rashes, no scalp seborrhea. Skin: Warm and dry, no rashes. Cardiac: Regular rate and rhythm, no murmurs rubs or gallops, no lower extremity edema.  Respiratory: Clear to auscultation bilaterally. Not using accessory muscles, speaking in full sentences.  I did examine the hairs lost closely under magnification, they are not broken and include the root.  Impression and Recommendations:    Acute telogen effluvium Currently under significant emotional stress at work, timing is consistent for when the hair loss started. Hair loss has been since before  Christmas. Several strands per day but no significant balding. Adding minoxidil, checking labs including CBC, CMP, ferritin, TSH. Return to see [place] in about a month to see how things are going. Certainly her rheumatoid arthritis treatments could also be a cause. ___________________________________________ Ihor Austin. Benjamin Stain, M.D., ABFM., CAQSM. Primary Care and Sports Medicine [facility]  Adjunct Instructor of Family Medicine  [employer]

## 2018-01-16 LAB — COMPREHENSIVE METABOLIC PANEL
AG Ratio: 1.4 (calc) (ref 1.0–2.5)
Albumin: 4 g/dL (ref 3.6–5.1)
BUN: 13 mg/dL (ref 7–25)
CO2: 29 mmol/L (ref 20–32)
Calcium: 9.4 mg/dL (ref 8.6–10.4)
Chloride: 105 mmol/L (ref 98–110)
Creat: 0.73 mg/dL (ref 0.50–1.05)
Globulin: 2.9 g/dL (calc) (ref 1.9–3.7)
Potassium: 4.1 mmol/L (ref 3.5–5.3)
Sodium: 140 mmol/L (ref 135–146)
Total Bilirubin: 0.7 mg/dL (ref 0.2–1.2)
Total Protein: 6.9 g/dL (ref 6.1–8.1)

## 2018-01-16 LAB — CBC
HCT: 38.9 % (ref 35.0–45.0)
Hemoglobin: 13.4 g/dL (ref 11.7–15.5)
MCH: 29.3 pg (ref 27.0–33.0)
MCHC: 34.4 g/dL (ref 32.0–36.0)
MCV: 85.1 fL (ref 80.0–100.0)
MPV: 9.8 fL (ref 7.5–12.5)
Platelets: 370 10*3/uL (ref 140–400)
RBC: 4.57 10*6/uL (ref 3.80–5.10)
RDW: 12 % (ref 11.0–15.0)
WBC: 6.9 10*3/uL (ref 3.8–10.8)

## 2018-01-16 LAB — FERRITIN: Ferritin: 227 ng/mL (ref 10–232)

## 2018-01-16 LAB — COMPREHENSIVE METABOLIC PANEL WITH GFR
ALT: 14 U/L (ref 6–29)
AST: 17 U/L (ref 10–35)
Alkaline phosphatase (APISO): 69 U/L (ref 33–130)
Glucose, Bld: 92 mg/dL (ref 65–99)

## 2018-01-16 LAB — TSH: TSH: 1.57 m[IU]/L (ref 0.40–4.50)

## 2018-02-19 ENCOUNTER — Ambulatory Visit: Payer: PRIVATE HEALTH INSURANCE | Admitting: Sports Medicine

## 2018-03-29 ENCOUNTER — Telehealth: Payer: Self-pay | Admitting: Physician Assistant

## 2018-03-29 MED ORDER — MOMETASONE FUROATE 220 MCG/INH IN AEPB: 2.0000 | INHALATION_SPRAY | Freq: Every day | RESPIRATORY_TRACT | 1 refills | Status: DC

## 2018-03-29 MED ORDER — ALBUTEROL SULFATE HFA 108 (90 BASE) MCG/ACT IN AERS: 2.0000 | INHALATION_SPRAY | Freq: Four times a day (QID) | RESPIRATORY_TRACT | 2 refills | Status: DC | PRN

## 2018-03-29 NOTE — Telephone Encounter (Signed)
Pt called clinic today for an appointment, her asthma is getting bad due to allergies. Pt was in the process of being scheduled for our last opening, when someone else was placed in that slot. I did advise Pt she is over due for an appt with PCP, but that I would route her request to Provider in office for review. She is requesting a refill on her Albuterol inhaler, and also an Rx for ASMANEX 60 METERED DOSES 220 MCG/INH inhaler. PCP had written this for her before and it helped her a lot during this season.

## 2018-03-29 NOTE — Telephone Encounter (Signed)
Pt advised. She was able to get Rx's today. Transferred to scheduling to make appt with PCP.

## 2018-03-29 NOTE — Telephone Encounter (Signed)
Meds sent, thanks!

## 2018-04-19 ENCOUNTER — Ambulatory Visit (INDEPENDENT_AMBULATORY_CARE_PROVIDER_SITE_OTHER): Payer: PRIVATE HEALTH INSURANCE | Admitting: Physician Assistant

## 2018-04-19 ENCOUNTER — Encounter: Payer: Self-pay | Admitting: Physician Assistant

## 2018-04-19 VITALS — BP 138/82 | HR 86 | Ht 62.0 in | Wt 199.0 lb

## 2018-04-19 DIAGNOSIS — L309 Dermatitis, unspecified: Secondary | ICD-10-CM | POA: Insufficient documentation

## 2018-04-19 DIAGNOSIS — J45909 Unspecified asthma, uncomplicated: Secondary | ICD-10-CM | POA: Diagnosis not present

## 2018-04-19 DIAGNOSIS — E7800 Pure hypercholesterolemia, unspecified: Secondary | ICD-10-CM | POA: Diagnosis not present

## 2018-04-19 DIAGNOSIS — Z1231 Encounter for screening mammogram for malignant neoplasm of breast: Secondary | ICD-10-CM

## 2018-04-19 DIAGNOSIS — L308 Other specified dermatitis: Secondary | ICD-10-CM

## 2018-04-19 MED ORDER — CRISABOROLE 2 % EX OINT: 1.0000 "application " | TOPICAL_OINTMENT | Freq: Two times a day (BID) | CUTANEOUS | 2 refills | Status: DC

## 2018-04-19 MED ORDER — MOMETASONE FUROATE 220 MCG/INH IN AEPB: 2.0000 | INHALATION_SPRAY | Freq: Every day | RESPIRATORY_TRACT | 11 refills | Status: DC

## 2018-04-19 NOTE — Patient Instructions (Signed)
Red yeast rice 1200mg  twice a day.    Preventing High Cholesterol Cholesterol is a waxy, fat-like substance that your body needs in small amounts. Your liver makes all the cholesterol that your body needs. Having high cholesterol (hypercholesterolemia) increases your risk for heart disease and stroke. Extra (excess) cholesterol comes from the food you eat, such as animal-based fat (saturated fat) from meat and some dairy products. High cholesterol can often be prevented with diet and lifestyle changes. If you already have high cholesterol, you can control it with diet and lifestyle changes, as well as medicine. What nutrition changes can be made?  Eat less saturated fat. Foods that contain saturated fat include red meat and some dairy products.  Avoid processed meats, like bacon and lunch meats.  Avoid trans fats, which are found in margarine and some baked goods.  Avoid foods and beverages that have added sugars.  Eat more fruits, vegetables, and whole grains.  Choose healthy sources of protein, such as fish, poultry, and nuts.  Choose healthy sources of fat, such as: ? Nuts. ? Vegetable oils, especially olive oil. ? Fish that have healthy fats (omega-3 fatty acids), such as mackerel or salmon. What lifestyle changes can be made?  Lose weight if you are overweight. Losing 5-10 lb (2.3-4.5 kg) can help prevent or control high cholesterol and reduce your risk for diabetes and high blood pressure. Ask your health care provider to help you with a diet and exercise plan to safely lose weight.  Get enough exercise. Do at least 150 minutes of moderate-intensity exercise each week. ? You could do this in short exercise sessions several times a day, or you could do longer exercise sessions a few times a week. For example, you could take a brisk 10-minute walk or bike ride, 3 times a day, for 5 days a week.  Do not smoke. If you need help quitting, ask your health care provider.  Limit your  alcohol intake. If you drink alcohol, limit alcohol intake to no more than 1 drink a day for nonpregnant women and 2 drinks a day for men. One drink equals 12 oz of beer, 5 oz of wine, or 1 oz of hard liquor. Why are these changes important? If you have high cholesterol, deposits (plaques) may build up on the walls of your blood vessels. Plaques make the arteries narrower and stiffer, which can restrict or block blood flow and cause blood clots to form. This greatly increases your risk for heart attack and stroke. Making diet and lifestyle changes can reduce your risk for these life-threatening conditions. What can I do to lower my risk?  Manage your risk factors for high cholesterol. Talk with your health care provider about all of your risk factors and how to lower your risk.  Manage other conditions that you have, such as diabetes or high blood pressure (hypertension).  Have your cholesterol checked at regular intervals.  Keep all follow-up visits as told by your health care provider. This is important. How is this treated? In addition to diet and lifestyle changes, your health care provider may recommend medicines to help lower cholesterol, such as a medicine to reduce the amount of cholesterol made in your liver. You may need medicine if:  Diet and lifestyle changes do not lower your cholesterol enough.  You have high cholesterol and other risk factors for heart disease or stroke.  Take over-the-counter and prescription medicines only as told by your health care provider. Where to find more information:  American Heart Association: ThisTune.com.pt.jsp  National Heart, Lung, and Blood Institute: FrenchToiletries.com.cy Summary  High cholesterol increases your risk for heart disease and stroke. By keeping your cholesterol level low, you can reduce your risk for these  conditions.  Diet and lifestyle changes are the most important steps in preventing high cholesterol.  Work with your health care provider to manage your risk factors, and have your blood tested regularly. This information is not intended to replace advice given to you by your health care provider. Make sure you discuss any questions you have with your health care provider. Document Released: 12/23/2015 Document Revised: 08/16/2016 Document Reviewed: 08/16/2016 Elsevier Interactive Patient Education  [employer].

## 2018-04-22 ENCOUNTER — Encounter: Payer: Self-pay | Admitting: Physician Assistant

## 2018-04-22 NOTE — Progress Notes (Signed)
   Subjective:    Patient ID: April [facility], female    DOB: [birth year], 62 y.o.   MRN: [id number]  HPI Pt is a 62 yo female who presents to the clinic for asthma follow up.   Asthma- doing great with asmanex. No problems or concerns. Rarely uses rescue inhaler. Able to exercise and do whatever chores she would like.   She has an itchy rash behind ears for the last few years. She has tried topical steriods and antifungals with no relief.   .. Active Ambulatory Problems    Diagnosis Date Noted  . Rheumatoid arthritis (HCC) 06/05/2014  . Asthma 06/05/2014  . Osteopenia 08/01/2014  . Asthma, chronic 01/02/2015  . Hyperplastic colon polyp 01/02/2015  . Nodule of external ear 10/12/2015  . Seborrheic dermatitis 10/12/2015  . Bilateral foot pain 10/15/2015  . Posterior tibial tendonitis 12/31/2015  . Vitamin D deficiency 07/03/2017  . Elevated LDL cholesterol level 07/13/2017  . Acute telogen effluvium 01/15/2018  . Eczema 04/19/2018   Resolved Ambulatory Problems    Diagnosis Date Noted  . No Resolved Ambulatory Problems   Past Medical History:  Diagnosis Date  . Asthma   . Rheumatoid arthritis (HCC)   . Vertigo, labyrinthine       Review of Systems See HPI.     Objective:   Physical Exam  Constitutional: She is oriented to person, place, and time. She appears well-developed and well-nourished.  Obese.   HENT:  Head: Normocephalic and atraumatic.  Eyes: Pupils are equal, round, and reactive to light. EOM are normal.  Cardiovascular: Normal rate and regular rhythm.  Pulmonary/Chest: Effort normal and breath sounds normal.  Neurological: She is alert and oriented to person, place, and time.  Skin:  Dry, scaly irritated skin behind both ears.   Psychiatric: She has a normal mood and affect. Her behavior is normal.          Assessment & Plan:  Marland KitchenMarland KitchenDiagnoses and all orders for this visit:  Chronic asthma without complication, unspecified asthma severity,  unspecified whether persistent -     mometasone (ASMANEX 60 METERED DOSES) 220 MCG/INH inhaler; Inhale 2 puffs into the lungs daily.  Visit for screening mammogram -     MM 3D SCREEN BREAST BILATERAL  Other eczema -     Crisaborole (EUCRISA) 2 % OINT; Apply 1 application topically 2 (two) times daily.  Elevated LDL cholesterol level   Doing great on asmanex. No problems or concerns. Refilled today.   Tried topical steroids and antifungals for rash behind ears. Sent eucrisa.   Cholesterol is elevated. She did not want to discuss. Follow up in 6 months to discuss risk factors and overall health screenings.

## 2018-04-23 ENCOUNTER — Ambulatory Visit (INDEPENDENT_AMBULATORY_CARE_PROVIDER_SITE_OTHER): Payer: PRIVATE HEALTH INSURANCE

## 2018-04-23 DIAGNOSIS — Z1231 Encounter for screening mammogram for malignant neoplasm of breast: Secondary | ICD-10-CM | POA: Diagnosis not present

## 2018-04-26 NOTE — Progress Notes (Signed)
Call pt: normal mammogram. Follow up in 1 year.

## 2018-07-27 LAB — LIPID PANEL
Cholesterol: 249 — AB (ref 0–200)
HDL: 76 — AB (ref 35–70)
LDL Cholesterol: 155
Triglycerides: 75 (ref 40–160)

## 2018-07-27 LAB — HEMOGLOBIN A1C: HEMOGLOBIN A1C: 5

## 2018-07-29 ENCOUNTER — Telehealth: Payer: Self-pay | Admitting: Physician Assistant

## 2018-07-29 NOTE — Telephone Encounter (Signed)
April [facility] called this morning to schedule physicals for herself and her husband. Their pcp is Jade. They need to have physicals by 07/24/18. She stated that you did her physical last year and was wondering if she could get in with you for a physical this year as well. Her husband also. She is concerned that if they wait too close to the end of the month, that the insurance company will not get the paperwork in time.

## 2018-07-29 NOTE — Telephone Encounter (Signed)
Whether or not the insurance gets the paperwork and time, it should be fine as long as the physical was done prior to the end of the month.  It is not appropriate for [place] to be doing annual physicals for each other's patients.  They need to follow-up with Jade.

## 2018-07-30 NOTE — Telephone Encounter (Signed)
Yes if I need to make special arrangements to get them in by the end of the month I will. I know I have/will be off a lot this month.

## 2018-08-17 ENCOUNTER — Encounter: Payer: Self-pay | Admitting: Physician Assistant

## 2018-08-17 ENCOUNTER — Ambulatory Visit (INDEPENDENT_AMBULATORY_CARE_PROVIDER_SITE_OTHER): Payer: PRIVATE HEALTH INSURANCE | Admitting: Physician Assistant

## 2018-08-17 ENCOUNTER — Encounter: Payer: PRIVATE HEALTH INSURANCE | Admitting: Physician Assistant

## 2018-08-17 VITALS — BP 134/84 | HR 79 | Ht 62.0 in | Wt 202.0 lb

## 2018-08-17 DIAGNOSIS — Z23 Encounter for immunization: Secondary | ICD-10-CM | POA: Diagnosis not present

## 2018-08-17 DIAGNOSIS — Z6841 Body Mass Index (BMI) 40.0 and over, adult: Secondary | ICD-10-CM | POA: Insufficient documentation

## 2018-08-17 DIAGNOSIS — E6609 Other obesity due to excess calories: Secondary | ICD-10-CM | POA: Diagnosis not present

## 2018-08-17 DIAGNOSIS — E7800 Pure hypercholesterolemia, unspecified: Secondary | ICD-10-CM | POA: Diagnosis not present

## 2018-08-17 DIAGNOSIS — Z0000 Encounter for general adult medical examination without abnormal findings: Secondary | ICD-10-CM

## 2018-08-17 DIAGNOSIS — Z6837 Body mass index (BMI) 37.0-37.9, adult: Secondary | ICD-10-CM

## 2018-08-17 NOTE — Progress Notes (Signed)
Subjective:     April Norman is a 62 y.o. female and is here for a comprehensive physical exam. The patient reports no problems.  Social History   Socioeconomic History  . Marital status: Married    Spouse name: Not on file  . Number of children: Not on file  . Years of education: Not on file  . Highest education level: Not on file  Occupational History  . Not on file  Social Needs  . Financial resource strain: Not on file  . Food insecurity:    Worry: Not on file    Inability: Not on file  . Transportation needs:    Medical: Not on file    Non-medical: Not on file  Tobacco Use  . Smoking status: Former Games developer  . Smokeless tobacco: Former Engineer, water and Sexual Activity  . Alcohol use: No  . Drug use: No  . Sexual activity: Yes  Lifestyle  . Physical activity:    Days per week: Not on file    Minutes per session: Not on file  . Stress: Not on file  Relationships  . Social connections:    Talks on phone: Not on file    Gets together: Not on file    Attends religious service: Not on file    Active member of club or organization: Not on file    Attends meetings of clubs or organizations: Not on file    Relationship status: Not on file  . Intimate partner violence:    Fear of current or ex partner: Not on file    Emotionally abused: Not on file    Physically abused: Not on file    Forced sexual activity: Not on file  Other Topics Concern  . Not on file  Social History Narrative  . Not on file   Health Maintenance  Topic Date Due  . INFLUENZA VACCINE  07/22/2018  . PAP SMEAR  10/11/2025 (Originally 10/04/1977)  . MAMMOGRAM  04/24/2019  . TETANUS/TDAP  12/22/2021  . COLONOSCOPY  11/23/2024  . Hepatitis C Screening  Completed  . HIV Screening  Completed    The following portions of the patient's history were reviewed and updated as appropriate: allergies, current medications, past family history, past medical history, past social history, past surgical  history and problem list.  Review of Systems A comprehensive review of systems was negative.   Objective:    BP 134/84   Pulse 79   Ht 5\' 2"  (1.575 m)   Wt 202 lb (91.6 kg)   BMI 36.95 kg/m  General appearance: alert, cooperative and appears stated age Head: Normocephalic, without obvious abnormality, atraumatic Eyes: conjunctivae/corneas clear. PERRL, EOM's intact. Fundi benign. Ears: normal TM's and external ear canals both ears Nose: Nares normal. Septum midline. Mucosa normal. No drainage or sinus tenderness. Throat: lips, mucosa, and tongue normal; teeth and gums normal Neck: no adenopathy, no carotid bruit, no JVD, supple, symmetrical, trachea midline and thyroid not enlarged, symmetric, no tenderness/mass/nodules Back: symmetric, no curvature. ROM normal. No CVA tenderness. Lungs: clear to auscultation bilaterally Heart: regular rate and rhythm, S1, S2 normal, no murmur, click, rub or gallop Abdomen: soft, non-tender; bowel sounds normal; no masses,  no organomegaly Extremities: extremities normal, atraumatic, no cyanosis or edema Pulses: 2+ and symmetric Skin: Skin color, texture, turgor normal. No rashes or lesions Lymph nodes: Cervical, supraclavicular, and axillary nodes normal. Neurologic: Alert and oriented X 3, normal strength and tone. Normal symmetric reflexes. Normal coordination and gait  Assessment:    Healthy female exam.      Plan:   Marland KitchenMarland KitchenEmelie was seen today for annual exam.  Diagnoses and all orders for this visit:  Routine physical examination  Elevated LDL cholesterol level  Class 2 obesity due to excess calories without serious comorbidity with body mass index (BMI) of 37.0 to 37.9 in adult  Need for immunization against influenza -     Flu Vaccine QUAD 36+ mos IM   .Marland Kitchen Depression screen [facility] 2/9 08/17/2018 04/19/2018 07/13/2017  Decreased Interest 0 0 0  Down, Depressed, Hopeless 0 0 0  PHQ - 2 Score 0 0 0   .Marland Kitchen Discussed 150 minutes of  exercise a week.  Encouraged vitamin D 1000 units and Calcium 1300mg  or 4 servings of dairy a day.  Mammogram/colonoscopy up to date.  Vaccines up to date.  Discuss shingrix with rheumatologist since it is a dead virus.   Pt brings in fasting labs. Elevated LDL at 155. HDL 70's. A1C 5. CV risk 4.5 perecent.   .Discussed low carb diet with 1500 calories and 80g of protein.  Exercising at least 150 minutes a week.  My Fitness Pal could be a Marland Kitchen.   See After Visit Summary for Counseling Recommendations

## 2018-08-17 NOTE — Patient Instructions (Addendum)

## 2018-08-19 ENCOUNTER — Encounter: Payer: Self-pay | Admitting: Physician Assistant

## 2018-09-14 DIAGNOSIS — K200 Eosinophilic esophagitis: Secondary | ICD-10-CM | POA: Insufficient documentation

## 2018-09-15 ENCOUNTER — Other Ambulatory Visit: Payer: Self-pay

## 2018-09-15 DIAGNOSIS — J45909 Unspecified asthma, uncomplicated: Secondary | ICD-10-CM

## 2018-09-15 MED ORDER — MOMETASONE FUROATE 220 MCG/INH IN AEPB: 2.0000 | INHALATION_SPRAY | Freq: Every day | RESPIRATORY_TRACT | 11 refills | Status: DC

## 2018-09-20 ENCOUNTER — Other Ambulatory Visit: Payer: Self-pay

## 2018-09-20 MED ORDER — ALBUTEROL SULFATE HFA 108 (90 BASE) MCG/ACT IN AERS: 2.0000 | INHALATION_SPRAY | Freq: Four times a day (QID) | RESPIRATORY_TRACT | 2 refills | Status: DC | PRN

## 2018-10-18 ENCOUNTER — Ambulatory Visit: Payer: PRIVATE HEALTH INSURANCE | Admitting: Physician Assistant

## 2018-11-01 ENCOUNTER — Encounter: Payer: Self-pay | Admitting: Physician Assistant

## 2018-11-01 ENCOUNTER — Ambulatory Visit (INDEPENDENT_AMBULATORY_CARE_PROVIDER_SITE_OTHER): Payer: PRIVATE HEALTH INSURANCE

## 2018-11-01 ENCOUNTER — Ambulatory Visit: Payer: PRIVATE HEALTH INSURANCE | Admitting: Physician Assistant

## 2018-11-01 VITALS — BP 130/89 | HR 114 | Temp 98.2°F | Resp 18 | Wt 199.0 lb

## 2018-11-01 DIAGNOSIS — R000 Tachycardia, unspecified: Secondary | ICD-10-CM

## 2018-11-01 DIAGNOSIS — R05 Cough: Secondary | ICD-10-CM

## 2018-11-01 DIAGNOSIS — J4541 Moderate persistent asthma with (acute) exacerbation: Secondary | ICD-10-CM | POA: Diagnosis not present

## 2018-11-01 DIAGNOSIS — R062 Wheezing: Secondary | ICD-10-CM | POA: Diagnosis not present

## 2018-11-01 MED ORDER — METHYLPREDNISOLONE SODIUM SUCC 125 MG IJ SOLR
125.0000 mg | Freq: Once | INTRAMUSCULAR | Status: AC
  Administered 2018-11-01: 125 mg via INTRAMUSCULAR

## 2018-11-01 MED ORDER — AZITHROMYCIN 250 MG PO TABS: ORAL_TABLET | ORAL | 0 refills | Status: DC

## 2018-11-01 MED ORDER — IPRATROPIUM-ALBUTEROL 0.5-2.5 (3) MG/3ML IN SOLN
3.0000 mL | Freq: Once | RESPIRATORY_TRACT | Status: AC
  Administered 2018-11-01: 3 mL via RESPIRATORY_TRACT

## 2018-11-01 MED ORDER — METHYLPREDNISOLONE ACETATE 40 MG/ML IJ SUSP
40.0000 mg | Freq: Once | INTRAMUSCULAR | Status: AC
  Administered 2018-11-01: 40 mg via INTRAMUSCULAR

## 2018-11-01 NOTE — Progress Notes (Signed)
130/89 

## 2018-11-01 NOTE — Progress Notes (Signed)
HPI:                                                                April Norman is a 62 y.o. female who presents to [name] Primary Care [facility] Medicine today for URI symptoms / asthma flare  Asthma  She complains of chest tightness, cough, shortness of breath and sputum production. This is a new problem. The current episode started in the past 7 days (Saturday). The problem occurs daily. The cough is productive of sputum. Associated symptoms include malaise/fatigue. Pertinent negatives include no ear pain, fever, nasal congestion, rhinorrhea or sore throat. Her symptoms are alleviated by beta-agonist and OTC cough suppressant. She reports moderate improvement on treatment. Her past medical history is significant for asthma.  Reports recent travel to [place] several weeks ago.       Past Medical History:  Diagnosis Date  . Asthma   . Rheumatoid arthritis (HCC)   . Vertigo, labyrinthine    Past Surgical History:  Procedure Laterality Date  . ABDOMINAL HYSTERECTOMY    . CHOLECYSTECTOMY     Social History   Tobacco Use  . Smoking status: Former Games developer  . Smokeless tobacco: Former Engineer, water Use Topics  . Alcohol use: No   family history includes Diabetes in her brother and father; Heart disease in her father; Stomach cancer in her paternal grandmother; Stroke in her father.    ROS: negative except as noted in the HPI  Medications: Current Outpatient Medications  Medication Sig Dispense Refill  . albuterol (PROVENTIL HFA;VENTOLIN HFA) 108 (90 Base) MCG/ACT inhaler Inhale 2 puffs into the lungs every 6 (six) hours as needed for wheezing or shortness of breath. 1 Inhaler 2  . azithromycin (ZITHROMAX Z-PAK) 250 MG tablet Take 2 tablets (500 mg) on  Day 1,  followed by 1 tablet (250 mg) once daily on Days 2 through 5. 6 tablet 0  . Calcium-Magnesium-Vitamin D (CALCIUM 500 PO) Take by mouth.    Lennox Solders (EUCRISA) 2 % OINT Apply 1 application  topically 2 (two) times daily. 1 Tube 2  . gabapentin (NEURONTIN) 300 MG capsule One tab PO qHS for a week, then BID for a week, then TID. May double weekly to a max of 3,600mg /day 180 capsule 3  . hydroxychloroquine (PLAQUENIL) 200 MG tablet Take 1 tablet (200 mg total) by mouth 2 (two) times daily. 60 tablet 1  . InFLIXimab (REMICADE IV) Inject into the vein.    Marland Kitchen inFLIXimab in sodium chloride 0.9 % Inject into the vein every 6 (six) weeks.    . Minoxidil 5 % FOAM Apply 1 application topically 2 (two) times daily. Apply twice daily for up to 4 months 1 Can 11  . mometasone (ASMANEX, 60 METERED DOSES,) 220 MCG/INH inhaler Inhale 2 puffs into the lungs daily. 1 Inhaler 11  . Skin Protectants, Misc. (EUCERIN) cream Apply topically as needed for dry skin. 454 g 0  . sulfaSALAzine (AZULFIDINE) 500 MG tablet Take 1 tablet (500 mg total) by mouth 2 (two) times daily. 60 tablet 0  . triamcinolone cream (KENALOG) 0.5 % Apply 1 application topically 2 (two) times daily. To affected areas. 30 g 3   No current facility-administered medications for this visit.    Allergies  Allergen Reactions  . Prednisone Swelling    Swelling of face/neck "My allergist told me never to take this"  . Singulair [Montelukast Sodium]     diarrhea       Objective:  BP 130/89   Pulse (!) 114   Temp 98.2 F (36.8 C) (Oral)   Resp 18   Wt 199 lb (90.3 kg)   SpO2 97%   BMI 36.40 kg/m  Gen:  alert, not ill-appearing, no distress, appropriate for age HEENT: head normocephalic without obvious abnormality, conjunctiva and cornea clear, oropharynx clear, moist mucous membranes, no cervical adenopathy, trachea midline Pulm: Normal work of breathing, normal phonation, diffuse expiratory wheezes CV: tachycardic rate, regular rhythm, s1 and s2 distinct, no murmurs, clicks or rubs  Neuro: alert and oriented x 3, no tremor MSK: extremities atraumatic, normal gait and station Skin: intact, no rashes on exposed skin, no  jaundice, no cyanosis     No results found for this or any previous visit (from the past 72 hour(s)). No results found.    Assessment and Plan: 62 y.o. female with   .Diagnoses and all orders for this visit:  Moderate persistent intrinsic asthma with exacerbation -     azithromycin (ZITHROMAX Z-PAK) 250 MG tablet; Take 2 tablets (500 mg) on  Day 1,  followed by 1 tablet (250 mg) once daily on Days 2 through 5. -     DG Chest 2 View -     ipratropium-albuterol (DUONEB) 0.5-2.5 (3) MG/3ML nebulizer solution 3 mL -     methylPREDNISolone sodium succinate (SOLU-MEDROL) 125 mg/2 mL injection 125 mg -     methylPREDNISolone acetate (DEPO-MEDROL) injection 40 mg  Tachycardia with heart rate 100-120 beats per minute -     DG Chest 2 View  Afebrile, no tachypnea Mildly tachycardic at 110-114, SpO2 97% on RA at rest. CXR pending to assess for infiltrate. Will cover for CAP with Azithromycin given pneumonia risk factors Duoneb given in office otday Reports history of severe allergy to prednisone. She states she had swelling of her airway and her allergist told her never to take Prednisone again. She currently does well with inhaled and topical corticosteroids Solu-Medrol 125 mg and Depo-medrol 40 mg given in office today for asthma exacerbation. Patient observed for 15 minutes for signs of adverse reaction  Patient education and anticipatory guidance given Patient agrees with treatment plan Follow-up as needed if symptoms worsen or fail to improve  Levonne Hubert PA-C

## 2018-11-01 NOTE — Patient Instructions (Signed)
Albuterol 2-4 puffs q3-4 hours prn for 24-48 hours Azithromycin as prescribed for 5 days to cover for infection You received steroid medication in an injection today. This should begin working right away, but may take 48 hours to see full effect Okay to continue Nyquil at bedtime for nighttime cough   Acute Bronchitis, Adult Acute bronchitis is sudden (acute) swelling of the air tubes (bronchi) in the lungs. Acute bronchitis causes these tubes to fill with mucus, which can make it hard to breathe. It can also cause coughing or wheezing. In adults, acute bronchitis usually goes away within 2 weeks. A cough caused by bronchitis may last up to 3 weeks. Smoking, allergies, and asthma can make the condition worse. Repeated episodes of bronchitis may cause further lung problems, such as chronic obstructive pulmonary disease (COPD). What are the causes? This condition can be caused by germs and by substances that irritate the lungs, including:  Cold and flu viruses. This condition is most often caused by the same virus that causes a cold.  Bacteria.  Exposure to tobacco smoke, dust, fumes, and air pollution.  What increases the risk? This condition is more likely to develop in people who:  Have close contact with someone with acute bronchitis.  Are exposed to lung irritants, such as tobacco smoke, dust, fumes, and vapors.  Have a weak immune system.  Have a respiratory condition such as asthma.  What are the signs or symptoms? Symptoms of this condition include:  A cough.  Coughing up clear, yellow, or green mucus.  Wheezing.  Chest congestion.  Shortness of breath.  A fever.  Body aches.  Chills.  A sore throat.  How is this diagnosed? This condition is usually diagnosed with a physical exam. During the exam, your health care provider may order tests, such as chest X-rays, to rule out other conditions. He or she may also:  Test a sample of your mucus for bacterial  infection.  Check the level of oxygen in your blood. This is done to check for pneumonia.  Do a chest X-ray or lung function testing to rule out pneumonia and other conditions.  Perform blood tests.  Your health care provider will also ask about your symptoms and medical history. How is this treated? Most cases of acute bronchitis clear up over time without treatment. Your health care provider may recommend:  Drinking more fluids. Drinking more makes your mucus thinner, which may make it easier to breathe.  Taking a medicine for a fever or cough.  Taking an antibiotic medicine.  Using an inhaler to help improve shortness of breath and to control a cough.  Using a cool mist vaporizer or humidifier to make it easier to breathe.  Follow these instructions at home: Medicines  Take over-the-counter and prescription medicines only as told by your health care provider.  If you were prescribed an antibiotic, take it as told by your health care provider. Do not stop taking the antibiotic even if you start to feel better. General instructions  Get plenty of rest.  Drink enough fluids to keep your urine clear or pale yellow.  Avoid smoking and secondhand smoke. Exposure to cigarette smoke or irritating chemicals will make bronchitis worse. If you smoke and you need help quitting, ask your health care provider. Quitting smoking will help your lungs heal faster.  Use an inhaler, cool mist vaporizer, or humidifier as told by your health care provider.  Keep all follow-up visits as told by your health care provider.  This is important. How is this prevented? To lower your risk of getting this condition again:  Wash your hands often with soap and water. If soap and water are not available, use hand sanitizer.  Avoid contact with people who have cold symptoms.  Try not to touch your hands to your mouth, nose, or eyes.  Make sure to get the flu shot every year.  Contact a health care  provider if:  Your symptoms do not improve in 2 weeks of treatment. Get help right away if:  You cough up blood.  You have chest pain.  You have severe shortness of breath.  You become dehydrated.  You faint or keep feeling like you are going to faint.  You keep vomiting.  You have a severe headache.  Your fever or chills gets worse. This information is not intended to replace advice given to you by your health care provider. Make sure you discuss any questions you have with your health care provider. Document Released: 01/15/2005 Document Revised: 07/02/2016 Document Reviewed: 05/28/2016 Elsevier Interactive Patient Education  [employer].

## 2018-11-02 NOTE — Progress Notes (Signed)
Chest x-ray was negative for any pneumonia.  Treatment plan does not change.

## 2018-11-07 ENCOUNTER — Encounter: Payer: Self-pay | Admitting: Physician Assistant

## 2018-12-06 ENCOUNTER — Other Ambulatory Visit: Payer: Self-pay

## 2018-12-06 DIAGNOSIS — J45909 Unspecified asthma, uncomplicated: Secondary | ICD-10-CM

## 2018-12-06 NOTE — Telephone Encounter (Signed)
Patient called today in need of refills on her albuterol inhaler, and her Asmanex inhaler. The pharmacy she uses is [facility]. I have pended the medications for refill. Thanks!

## 2018-12-07 MED ORDER — ALBUTEROL SULFATE HFA 108 (90 BASE) MCG/ACT IN AERS: 2.0000 | INHALATION_SPRAY | Freq: Four times a day (QID) | RESPIRATORY_TRACT | 2 refills | Status: DC | PRN

## 2018-12-07 MED ORDER — MOMETASONE FUROATE 220 MCG/INH IN AEPB: 2.0000 | INHALATION_SPRAY | Freq: Every day | RESPIRATORY_TRACT | 11 refills | Status: DC

## 2019-03-28 ENCOUNTER — Other Ambulatory Visit: Payer: Self-pay

## 2019-03-28 DIAGNOSIS — J45909 Unspecified asthma, uncomplicated: Secondary | ICD-10-CM

## 2019-03-28 MED ORDER — MOMETASONE FUROATE 220 MCG/INH IN AEPB: 2.0000 | INHALATION_SPRAY | Freq: Every day | RESPIRATORY_TRACT | 1 refills | Status: DC

## 2019-03-28 MED ORDER — ALBUTEROL SULFATE HFA 108 (90 BASE) MCG/ACT IN AERS: 2.0000 | INHALATION_SPRAY | Freq: Four times a day (QID) | RESPIRATORY_TRACT | 1 refills | Status: DC | PRN

## 2019-09-07 ENCOUNTER — Other Ambulatory Visit: Payer: Self-pay | Admitting: Physician Assistant

## 2019-09-07 DIAGNOSIS — J45909 Unspecified asthma, uncomplicated: Secondary | ICD-10-CM

## 2019-10-10 IMAGING — DX DG CHEST 2V
2 series · 2 of 2 positions shown · non-contrast
Comparison: Radiographs June 21, 2014.

CLINICAL DATA: Cough, wheezing.

EXAM:
CHEST - 2 VIEW

[chest pa]
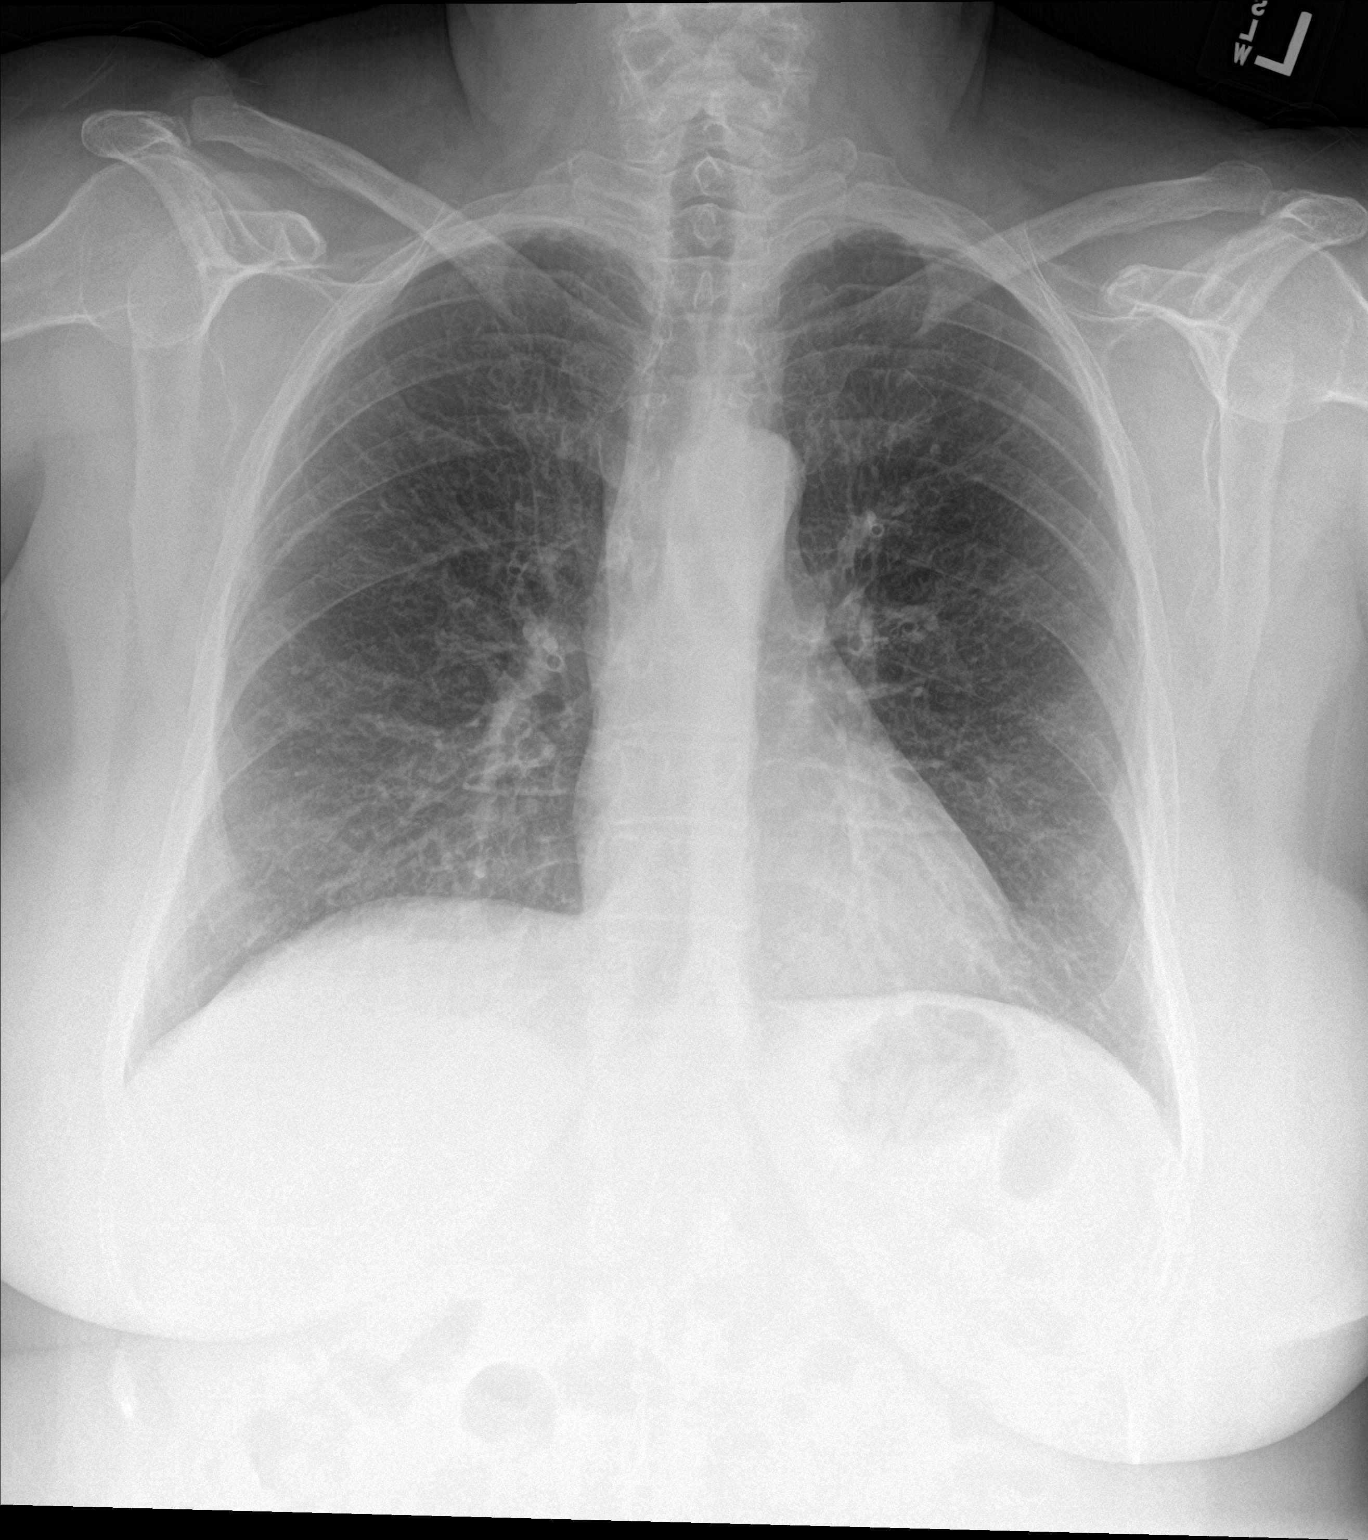

[chest lat]
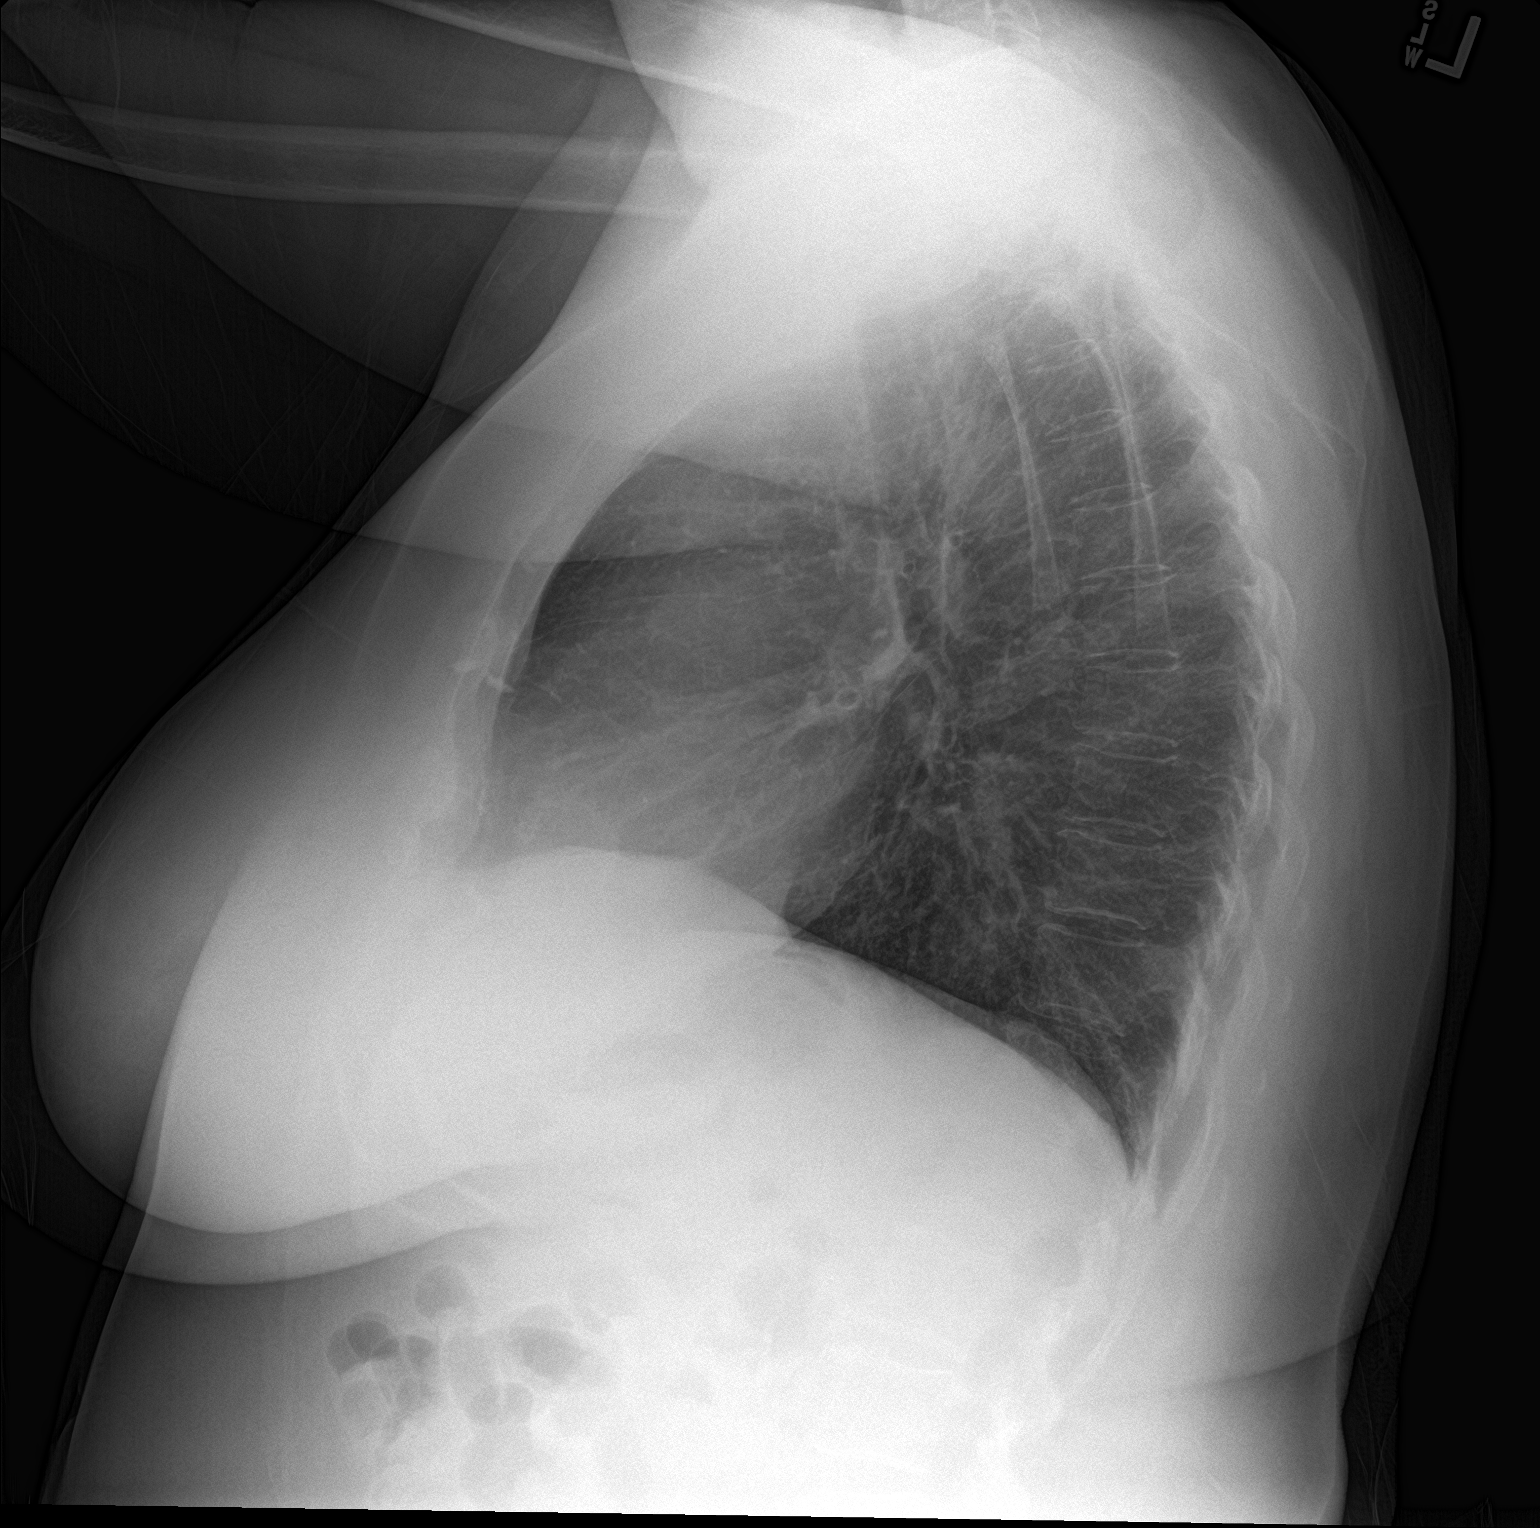

[2 of 2 positions shown; findings below may reference images not displayed]

FINDINGS: The heart size and mediastinal contours are within normal limits.
Both lungs are clear. The visualized skeletal structures are
unremarkable.
IMPRESSION: No active cardiopulmonary disease.

## 2019-10-14 ENCOUNTER — Other Ambulatory Visit: Payer: Self-pay | Admitting: Physician Assistant

## 2019-10-14 DIAGNOSIS — Z1231 Encounter for screening mammogram for malignant neoplasm of breast: Secondary | ICD-10-CM

## 2019-11-03 ENCOUNTER — Ambulatory Visit (INDEPENDENT_AMBULATORY_CARE_PROVIDER_SITE_OTHER): Payer: PRIVATE HEALTH INSURANCE

## 2019-11-03 ENCOUNTER — Other Ambulatory Visit: Payer: Self-pay

## 2019-11-03 DIAGNOSIS — Z1231 Encounter for screening mammogram for malignant neoplasm of breast: Secondary | ICD-10-CM | POA: Diagnosis not present

## 2019-11-04 NOTE — Progress Notes (Signed)
Normal mammogram. Follow up in 1 year.

## 2019-12-03 ENCOUNTER — Emergency Department (INDEPENDENT_AMBULATORY_CARE_PROVIDER_SITE_OTHER)
Admission: EM | Admit: 2019-12-03 | Discharge: 2019-12-03 | Disposition: A | Discharge status: Home / Self Care | Payer: PRIVATE HEALTH INSURANCE | Source: Home / Self Care

## 2019-12-03 ENCOUNTER — Other Ambulatory Visit: Payer: Self-pay

## 2019-12-03 ENCOUNTER — Encounter: Payer: Self-pay | Admitting: Emergency Medicine

## 2019-12-03 DIAGNOSIS — J4541 Moderate persistent asthma with (acute) exacerbation: Secondary | ICD-10-CM

## 2019-12-03 DIAGNOSIS — J0120 Acute ethmoidal sinusitis, unspecified: Secondary | ICD-10-CM | POA: Diagnosis not present

## 2019-12-03 DIAGNOSIS — R112 Nausea with vomiting, unspecified: Secondary | ICD-10-CM

## 2019-12-03 MED ORDER — AZITHROMYCIN 250 MG PO TABS: ORAL_TABLET | ORAL | 0 refills | Status: DC

## 2019-12-03 MED ORDER — PROMETHAZINE HCL 25 MG PO TABS: 25.0000 mg | ORAL_TABLET | Freq: Four times a day (QID) | ORAL | 0 refills | Status: DC | PRN

## 2019-12-03 NOTE — ED Triage Notes (Signed)
Here with c/o worsening GI upset x2 weeks, poor appetite and weight loss. Following GI doctor- US done on yesterday showed fatty liver; prescribed Omeprazole and Zofran. Reports not working. Denies vomiting,diff passing stools. Last Colonoscopy normal. F/u GI appt in January

## 2019-12-03 NOTE — ED Provider Notes (Signed)
[facility]    CSN: [id number] Arrival date & time: 12/03/19  0850      History   Chief Complaint Chief Complaint  Patient presents with  . GI Problem    HPI April Norman is a 63 y.o. female.   The history is provided by the patient. No language interpreter was used.  GI Problem This is a new problem. Episode onset: 2 weeks. The problem occurs constantly. The problem has been gradually worsening. Nothing aggravates the symptoms. Nothing relieves the symptoms. She has tried nothing for the symptoms. The treatment provided no relief.  Pt reports she has had nausea for 2 weeks.  Pt had an ultrasound and has seen Gi.  Gi doctor feels pt has a viral illness.  Pt reports zofran no longer helping with nausea. Pt reports ultrasound seems to have caused her to feel worse.  Pt thinks she has a sinus infection that is causing her symptoms.  Pt reports head congested and drainage in her throat.  Pt reports no covid exposures.   Past Medical History:  Diagnosis Date  . Asthma   . Rheumatoid arthritis (HCC)   . Vertigo, labyrinthine     Patient Active Problem List   Diagnosis Date Noted  . Eosinophilic esophagitis 09/14/2018  . Class 2 obesity due to excess calories without serious comorbidity with body mass index (BMI) of 37.0 to 37.9 in adult 08/17/2018  . Eczema 04/19/2018  . Acute telogen effluvium 01/15/2018  . Elevated LDL cholesterol level 07/13/2017  . Vitamin D deficiency 07/03/2017  . Bilateral foot pain 10/15/2015  . Nodule of external ear 10/12/2015  . Seborrheic dermatitis 10/12/2015  . Asthma, chronic 01/02/2015  . Hyperplastic colon polyp 01/02/2015  . Osteopenia 08/01/2014  . Rheumatoid arthritis (HCC) 06/05/2014  . Asthma 06/05/2014    Past Surgical History:  Procedure Laterality Date  . ABDOMINAL HYSTERECTOMY    . CHOLECYSTECTOMY      OB History   No obstetric history on file.      Home Medications    Prior to Admission medications     Medication Sig Start Date End Date Taking? Authorizing Provider  albuterol (VENTOLIN HFA) 108 (90 Base) MCG/ACT inhaler Inhale 2 puffs into the lungs every 6 (six) hours as needed for wheezing or shortness of breath. NEEDS APPT 10/14/19   Tandy GawBreeback, Jade L, PA-C  azithromycin (ZITHROMAX Z-PAK) 250 MG tablet Take 2 tablets (500 mg) on  Day 1,  followed by 1 tablet (250 mg) once daily on Days 2 through 5. 12/03/19   Elson AreasSofia, Salem Mastrogiovanni K, PA-C  Calcium-Magnesium-Vitamin D (CALCIUM 500 PO) Take by mouth.    [provider]  Crisaborole (EUCRISA) 2 % OINT Apply 1 application topically 2 (two) times daily. 04/19/18   Breeback, Jade L, PA-C  gabapentin (NEURONTIN) 300 MG capsule One tab PO qHS for a week, then BID for a week, then TID. May double weekly to a max of 3,600mg /day 07/03/17   Rodolph Bongorey, Evan S, MD  hydroxychloroquine (PLAQUENIL) 200 MG tablet Take 1 tablet (200 mg total) by mouth 2 (two) times daily. 06/21/14   Breeback, Jade L, PA-C  InFLIXimab (REMICADE IV) Inject into the vein.    [provider]  inFLIXimab in sodium chloride 0.9 % Inject into the vein every 6 (six) weeks.    [provider]  Minoxidil 5 % FOAM Apply 1 application topically 2 (two) times daily. Apply twice daily for up to 4 months 01/15/18   Monica Bectonhekkekandam, Thomas J,  MD  mometasone (ASMANEX, 60 METERED DOSES,) 220 MCG/INH inhaler Inhale 2 puffs into the lungs daily. NEEDS APPT 09/07/19   Jomarie Longs, PA-C  promethazine (PHENERGAN) 25 MG tablet Take 1 tablet (25 mg total) by mouth every 6 (six) hours as needed for nausea or vomiting. 12/03/19   Elson Areas, PA-C  Skin Protectants, Misc. (EUCERIN) cream Apply topically as needed for dry skin. 07/03/17   Monica Becton, MD  sulfaSALAzine (AZULFIDINE) 500 MG tablet Take 1 tablet (500 mg total) by mouth 2 (two) times daily. 07/06/14   Breeback, Jade L, PA-C  triamcinolone cream (KENALOG) 0.5 % Apply 1 application topically 2 (two) times daily. To affected  areas. 07/03/17   Rodolph Bong, MD    Family History Family History  Problem Relation Age of Onset  . Heart disease Father   . Diabetes Father   . Stroke Father   . Diabetes Brother   . Stomach cancer Paternal Grandmother     Social History Social History   Tobacco Use  . Smoking status: Former Games developer  . Smokeless tobacco: Former Engineer, water Use Topics  . Alcohol use: No  . Drug use: No     Allergies   Prednisone and Singulair [montelukast sodium]   Review of Systems Review of Systems  All other systems reviewed and are negative.    Physical Exam Triage Vital Signs ED Triage Vitals [12/03/19 0941]  Enc Vitals Group     BP (!) 142/80     Pulse Rate (!) 102     Resp      Temp 98.4 F (36.9 C)     Temp Source Oral     SpO2 96 %     Weight 198 lb 12.8 oz (90.2 kg)     Height 5' (1.524 m)     Head Circumference      Peak Flow      Pain Score 3     Pain Loc      Pain Edu?      Excl. in GC?    No data found.  Updated Vital Signs BP (!) 142/80 (BP Location: Right Arm)   Pulse (!) 102   Temp 98.4 F (36.9 C) (Oral)   Ht 5' (1.524 m)   Wt 90.2 kg   SpO2 96%   BMI 38.83 kg/m   Visual Acuity Right Eye Distance:   Left Eye Distance:   Bilateral Distance:    Right Eye Near:   Left Eye Near:    Bilateral Near:     Physical Exam Vitals and nursing note reviewed.  Constitutional:      Appearance: She is well-developed.  HENT:     Head: Normocephalic.  Cardiovascular:     Rate and Rhythm: Normal rate.  Pulmonary:     Effort: Pulmonary effort is normal.  Abdominal:     General: Abdomen is flat. There is no distension.     Tenderness: There is abdominal tenderness.     Comments: Diffuse tenderness.   Musculoskeletal:        General: Normal range of motion.     Cervical back: Normal range of motion.  Skin:    General: Skin is warm.  Neurological:     Mental Status: She is alert and oriented to person, place, and time.      UC  Treatments / Results  Labs (all labs ordered are listed, but only abnormal results are displayed) Labs Reviewed - No data to  display  EKG   Radiology No results found.  Procedures Procedures (including critical care time)  Medications Ordered in UC Medications - No data to display  Initial Impression / Assessment and Plan / UC Course  I have reviewed the triage vital signs and the nursing notes.  Pertinent labs & imaging results that were available during my care of the patient were reviewed by me and considered in my medical decision making (see chart for details).     MDM  Pt given rx for phenergan.  I will give her rx for zithromax.  Pt advised to see her primary care provider next week.  Go to the ED if symptoms worsen.  Final Clinical Impressions(s) / UC Diagnoses   Final diagnoses:  Acute ethmoidal sinusitis, recurrence not specified  Nausea and vomiting, intractability of vomiting not specified, unspecified vomiting type     Discharge Instructions     Drink plenty of fluids    ED Prescriptions    Medication Sig Dispense Auth. Provider   azithromycin (ZITHROMAX Z-PAK) 250 MG tablet Take 2 tablets (500 mg) on  Day 1,  followed by 1 tablet (250 mg) once daily on Days 2 through 5. 6 tablet Doral Digangi K, PA-C   promethazine (PHENERGAN) 25 MG tablet Take 1 tablet (25 mg total) by mouth every 6 (six) hours as needed for nausea or vomiting. 30 tablet Fransico Meadow, [place]     PDMP not reviewed this encounter.  An After Visit Summary was printed and given to the patient.    Fransico Meadow, [place] 12/03/19 1322

## 2019-12-03 NOTE — Discharge Instructions (Signed)
Drink plenty of fluids

## 2019-12-05 ENCOUNTER — Ambulatory Visit (INDEPENDENT_AMBULATORY_CARE_PROVIDER_SITE_OTHER): Payer: PRIVATE HEALTH INSURANCE | Admitting: Physician Assistant

## 2019-12-05 ENCOUNTER — Other Ambulatory Visit: Payer: Self-pay

## 2019-12-05 VITALS — Temp 98.4°F | Ht 60.0 in | Wt 198.0 lb

## 2019-12-05 DIAGNOSIS — Z20828 Contact with and (suspected) exposure to other viral communicable diseases: Secondary | ICD-10-CM

## 2019-12-05 DIAGNOSIS — Z20822 Contact with and (suspected) exposure to covid-19: Secondary | ICD-10-CM

## 2019-12-05 DIAGNOSIS — R5383 Other fatigue: Secondary | ICD-10-CM

## 2019-12-05 DIAGNOSIS — R05 Cough: Secondary | ICD-10-CM | POA: Diagnosis not present

## 2019-12-05 DIAGNOSIS — R197 Diarrhea, unspecified: Secondary | ICD-10-CM | POA: Diagnosis not present

## 2019-12-05 DIAGNOSIS — R059 Cough, unspecified: Secondary | ICD-10-CM

## 2019-12-05 NOTE — Progress Notes (Signed)
Patient ID: April Norman, female   DOB: [birth year], 63 y.o.   MRN: [id number] .Marland KitchenVirtual Visit via Video Note  I connected with April Norman on 12/05/2019  at 11:10 AM EST by a video enabled telemedicine application and verified that I am speaking with the correct person using two identifiers.  Location: Patient: home Provider: clinic   I discussed the limitations of evaluation and management by telemedicine and the availability of in person appointments. The patient expressed understanding and agreed to proceed.  History of Present Illness: Patient is a 63 year old female with asthma, eosinophilic esophagitis, rheumatoid arthritis who comes into the clinic with GI symptoms of diarrhea and abdominal cramping as well as fatigue.  Patient has a history of a gastric ulcer but that pain resolved and since early December she has had new upper abdominal pain.  She saw gastroenterology and a virtual visit on 11/29/2019.  CBC, CMP, lipase were normal.  Abdominal ultrasound was done and normal.  She was told to continue omeprazole and Pepcid.  She continues to have multiple loose stools but denies any melena or hematochezia.  She continues to feel nauseated but no ongoing vomiting.  She developed some sinus symptoms and had an ED visit on 12/03/2019.  They treated her for sinus infection.  She is almost done with an antibiotic and her sinus symptoms have resolved but she still left with her GI symptoms and now extreme fatigue.  She now notes that she was directly exposed to Covid over Thanksgiving weekend and wonders if it could be Covid. No SOB or difficultly breathing.   .. Active Ambulatory Problems    Diagnosis Date Noted  . Rheumatoid arthritis ([place]) 06/05/2014  . Asthma 06/05/2014  . Osteopenia 08/01/2014  . Asthma, chronic 01/02/2015  . Hyperplastic colon polyp 01/02/2015  . Nodule of external ear 10/12/2015  . Seborrheic dermatitis 10/12/2015  . Bilateral foot pain 10/15/2015  . Vitamin D  deficiency 07/03/2017  . Elevated LDL cholesterol level 07/13/2017  . Acute telogen effluvium 01/15/2018  . Eczema 04/19/2018  . Class 2 obesity due to excess calories without serious comorbidity with body mass index (BMI) of 37.0 to 37.9 in adult 08/17/2018  . Eosinophilic esophagitis 02/58/5277  . Close exposure to COVID-19 virus 12/06/2019  . Cough 12/06/2019   Resolved Ambulatory Problems    Diagnosis Date Noted  . Posterior tibial tendonitis 12/31/2015   Past Medical History:  Diagnosis Date  . Vertigo, labyrinthine    Reviewed med, allergy, problem list.    Observations/Objective: No acute distress. Normal appearance and mood.   .. Today's Vitals   12/05/19 1043  Temp: 98.4 F (36.9 C)  TempSrc: Oral  Weight: 198 lb (89.8 kg)  Height: 5' (1.524 m)   Body mass index is 38.67 kg/m.   Assessment and Plan: Marland KitchenMarland KitchenSyniah was seen today for nausea.  Diagnoses and all orders for this visit:  Suspected COVID-19 virus infection  Diarrhea, unspecified type  Fatigue, unspecified type  Cough  Close exposure to COVID-19 virus   I do agree with patient that she needs Covid testing.  I instructed her to go to the [facility] location today.  Self isolate until test results have returned.  I would continue to treat diarrhea with Imodium if needed.  Discussed rest and hydration.  She could certainly start taking some zinc.  Call with any worsening breathing and/or go to the emergency room.  It is reassuring that she had normal CBC, CMP, lipase earlier this month.  We could  consider a CT scan if GI symptoms are worsening and she is Covid negative.    Follow Up Instructions:    I discussed the assessment and treatment plan with the patient. The patient was provided an opportunity to ask questions and all were answered. The patient agreed with the plan and demonstrated an understanding of the instructions.   The patient was advised to call back or seek an in-person  evaluation if the symptoms worsen or if the condition fails to improve as anticipated.     Tandy Gaw, PA-C

## 2019-12-05 NOTE — Progress Notes (Deleted)
Started azithromycin Saturday - was seen in urgent care  Abdominal pain/ nausea Weakness Cough Drainage  Has not been tested for Covid

## 2019-12-06 ENCOUNTER — Encounter: Payer: Self-pay | Admitting: Physician Assistant

## 2019-12-06 ENCOUNTER — Other Ambulatory Visit: Payer: Self-pay | Admitting: Physician Assistant

## 2019-12-06 DIAGNOSIS — Z20822 Contact with and (suspected) exposure to covid-19: Secondary | ICD-10-CM | POA: Insufficient documentation

## 2019-12-06 DIAGNOSIS — R05 Cough: Secondary | ICD-10-CM | POA: Insufficient documentation

## 2019-12-06 DIAGNOSIS — R059 Cough, unspecified: Secondary | ICD-10-CM | POA: Insufficient documentation

## 2019-12-06 DIAGNOSIS — J45909 Unspecified asthma, uncomplicated: Secondary | ICD-10-CM

## 2019-12-07 LAB — NOVEL CORONAVIRUS, NAA: SARS-CoV-2, NAA: NOT DETECTED

## 2019-12-08 ENCOUNTER — Telehealth: Payer: Self-pay | Admitting: Neurology

## 2019-12-08 MED ORDER — BENZONATATE 200 MG PO CAPS: 200.0000 mg | ORAL_CAPSULE | Freq: Two times a day (BID) | ORAL | 1 refills | Status: DC | PRN

## 2019-12-08 NOTE — Telephone Encounter (Signed)
Usually I would send over prednisone at this time but you swell with regular prednisone. How about methylprednisolone?   Odas Ozer call me on my phone I have to travel to [place] for vaccine study. We may have to do a verbal.

## 2019-12-08 NOTE — Telephone Encounter (Signed)
Patient left vm stating still having congestion in chest, cough, and thick phlegm. She was negative for Covid. She is wondering about treatment options. Please advise.

## 2019-12-08 NOTE — Telephone Encounter (Signed)
Patient states she has swelling to the point of inability to breath with prednisone, if Luvenia Starch thinks it is safe she will try the other medication. She would like some Tesslon pearls.

## 2019-12-08 NOTE — Telephone Encounter (Signed)
Per Luvenia Starch to have patient take Mucinex 600 mg BID and use Tessalon for cough. Can call in stronger pain medicine if needed. Will hold on any sort of Prednisone for now. Patient in agreement. RX sent for [employer].

## 2020-08-10 ENCOUNTER — Ambulatory Visit (INDEPENDENT_AMBULATORY_CARE_PROVIDER_SITE_OTHER): Payer: PRIVATE HEALTH INSURANCE | Admitting: Physician Assistant

## 2020-08-10 ENCOUNTER — Other Ambulatory Visit: Payer: Self-pay

## 2020-08-10 ENCOUNTER — Encounter: Payer: Self-pay | Admitting: Physician Assistant

## 2020-08-10 VITALS — BP 142/76 | HR 88 | Ht 60.0 in | Wt 206.0 lb

## 2020-08-10 DIAGNOSIS — Z6841 Body Mass Index (BMI) 40.0 and over, adult: Secondary | ICD-10-CM

## 2020-08-10 DIAGNOSIS — Z23 Encounter for immunization: Secondary | ICD-10-CM

## 2020-08-10 DIAGNOSIS — Z131 Encounter for screening for diabetes mellitus: Secondary | ICD-10-CM

## 2020-08-10 DIAGNOSIS — Z0000 Encounter for general adult medical examination without abnormal findings: Secondary | ICD-10-CM

## 2020-08-10 DIAGNOSIS — R030 Elevated blood-pressure reading, without diagnosis of hypertension: Secondary | ICD-10-CM | POA: Insufficient documentation

## 2020-08-10 DIAGNOSIS — R635 Abnormal weight gain: Secondary | ICD-10-CM

## 2020-08-10 DIAGNOSIS — Z13220 Encounter for screening for lipoid disorders: Secondary | ICD-10-CM

## 2020-08-10 MED ORDER — WEGOVY 0.25 MG/0.5ML ~~LOC~~ SOAJ: 0.2500 mg | SUBCUTANEOUS | 0 refills | Status: DC

## 2020-08-10 NOTE — Patient Instructions (Signed)
Health Maintenance, Female Adopting a healthy lifestyle and getting preventive care are important in promoting health and wellness. Ask your health care provider about:  The right schedule for you to have regular tests and exams.  Things you can do on your own to prevent diseases and keep yourself healthy. What should I know about diet, weight, and exercise? Eat a healthy diet   Eat a diet that includes plenty of vegetables, fruits, low-fat dairy products, and lean protein.  Do not eat a lot of foods that are high in solid fats, added sugars, or sodium. Maintain a healthy weight Body mass index (BMI) is used to identify weight problems. It estimates body fat based on height and weight. Your health care provider can help determine your BMI and help you achieve or maintain a healthy weight. Get regular exercise Get regular exercise. This is one of the most important things you can do for your health. Most adults should:  Exercise for at least 150 minutes each week. The exercise should increase your heart rate and make you sweat (moderate-intensity exercise).  Do strengthening exercises at least twice a week. This is in addition to the moderate-intensity exercise.  Spend less time sitting. Even light physical activity can be beneficial. Watch cholesterol and blood lipids Have your blood tested for lipids and cholesterol at 62 years of age, then have this test every 5 years. Have your cholesterol levels checked more often if:  Your lipid or cholesterol levels are high.  You are older than 62 years of age.  You are at high risk for heart disease. What should I know about cancer screening? Depending on your health history and family history, you may need to have cancer screening at various ages. This may include screening for:  Breast cancer.  Cervical cancer.  Colorectal cancer.  Skin cancer.  Lung cancer. What should I know about heart disease, diabetes, and high blood  pressure? Blood pressure and heart disease  High blood pressure causes heart disease and increases the risk of stroke. This is more likely to develop in people who have high blood pressure readings, are of African descent, or are overweight.  Have your blood pressure checked: ? Every 3-5 years if you are 18-39 years of age. ? Every year if you are 40 years old or older. Diabetes Have regular diabetes screenings. This checks your fasting blood sugar level. Have the screening done:  Once every three years after age 40 if you are at a normal weight and have a low risk for diabetes.  More often and at a younger age if you are overweight or have a high risk for diabetes. What should I know about preventing infection? Hepatitis B If you have a higher risk for hepatitis B, you should be screened for this virus. Talk with your health care provider to find out if you are at risk for hepatitis B infection. Hepatitis C Testing is recommended for:  Everyone born from 1945 through 1965.  Anyone with known risk factors for hepatitis C. Sexually transmitted infections (STIs)  Get screened for STIs, including gonorrhea and chlamydia, if: ? You are sexually active and are younger than 62 years of age. ? You are older than 62 years of age and your health care provider tells you that you are at risk for this type of infection. ? Your sexual activity has changed since you were last screened, and you are at increased risk for chlamydia or gonorrhea. Ask your health care provider if   you are at risk.  Ask your health care provider about whether you are at high risk for HIV. Your health care provider may recommend a prescription medicine to help prevent HIV infection. If you choose to take medicine to prevent HIV, you should first get tested for HIV. You should then be tested every 3 months for as long as you are taking the medicine. Pregnancy  If you are about to stop having your period (premenopausal) and  you may become pregnant, seek counseling before you get pregnant.  Take 400 to 800 micrograms (mcg) of folic acid every day if you become pregnant.  Ask for birth control (contraception) if you want to prevent pregnancy. Osteoporosis and menopause Osteoporosis is a disease in which the bones lose minerals and strength with aging. This can result in bone fractures. If you are 65 years old or older, or if you are at risk for osteoporosis and fractures, ask your health care provider if you should:  Be screened for bone loss.  Take a calcium or vitamin D supplement to lower your risk of fractures.  Be given hormone replacement therapy (HRT) to treat symptoms of menopause. Follow these instructions at home: Lifestyle  Do not use any products that contain nicotine or tobacco, such as cigarettes, e-cigarettes, and chewing tobacco. If you need help quitting, ask your health care provider.  Do not use street drugs.  Do not share needles.  Ask your health care provider for help if you need support or information about quitting drugs. Alcohol use  Do not drink alcohol if: ? Your health care provider tells you not to drink. ? You are pregnant, may be pregnant, or are planning to become pregnant.  If you drink alcohol: ? Limit how much you use to 0-1 drink a day. ? Limit intake if you are breastfeeding.  Be aware of how much alcohol is in your drink. In the [place] one drink equals one 12 oz bottle of beer (355 mL), one 5 oz glass of wine (148 mL), or one 1 oz glass of hard liquor (44 mL). General instructions  Schedule regular health, dental, and eye exams.  Stay current with your vaccines.  Tell your health care provider if: ? You often feel depressed. ? You have ever been abused or do not feel safe at home. Summary  Adopting a healthy lifestyle and getting preventive care are important in promoting health and wellness.  Follow your health care provider's instructions about healthy  diet, exercising, and getting tested or screened for diseases.  Follow your health care provider's instructions on monitoring your cholesterol and blood pressure. This information is not intended to replace advice given to you by your health care provider. Make sure you discuss any questions you have with your health care provider. Document Revised: 12/01/2018 Document Reviewed: 12/01/2018 Elsevier Patient Education  2020 Elsevier Inc.  

## 2020-08-10 NOTE — Progress Notes (Signed)
wSubjective:     April Norman is a 64 y.o. female and is here for a comprehensive physical exam. The patient reports problems - weight gain during covid and no motavation to change. .  Social History   Socioeconomic History   Marital status: Married    Spouse name: Not on file   Number of children: Not on file   Years of education: Not on file   Highest education level: Not on file  Occupational History   Not on file  Tobacco Use   Smoking status: Former Smoker   Smokeless tobacco: Former Neurosurgeon  Substance and Sexual Activity   Alcohol use: No   Drug use: No   Sexual activity: Yes  Other Topics Concern   Not on file  Social History Narrative   Not on file   Social Determinants of Health   Financial Resource Strain:    Difficulty of Paying Living Expenses: Not on file  Food Insecurity:    Worried About Programme researcher, broadcasting/film/video in the Last Year: Not on file   [employer] of Food in the Last Year: Not on file  Transportation Needs:    Lack of Transportation (Medical): Not on file   Lack of Transportation (Non-Medical): Not on file  Physical Activity:    Days of Exercise per Week: Not on file   Minutes of Exercise per Session: Not on file  Stress:    Feeling of Stress : Not on file  Social Connections:    Frequency of Communication with Friends and Family: Not on file   Frequency of Social Gatherings with Friends and Family: Not on file   Attends Religious Services: Not on file   Active Member of Clubs or Organizations: Not on file   Attends Banker Meetings: Not on file   Marital Status: Not on file  Intimate Partner Violence:    Fear of Current or Ex-Partner: Not on file   Emotionally Abused: Not on file   Physically Abused: Not on file   Sexually Abused: Not on file   Health Maintenance  Topic Date Due   MAMMOGRAM  11/02/2020   TETANUS/TDAP  12/22/2021   COLONOSCOPY  11/23/2024   INFLUENZA VACCINE  Completed   COVID-19  Vaccine  Completed   Hepatitis C Screening  Completed   HIV Screening  Completed    The following portions of the patient's history were reviewed and updated as appropriate: allergies, current medications, past family history, past medical history, past social history, past surgical history and problem list.  Review of Systems Pertinent items noted in HPI and remainder of comprehensive ROS otherwise negative.   Objective:    BP (!) 142/76    Pulse 88    Ht 5' (1.524 m)    Wt 206 lb (93.4 kg)    BMI 40.23 kg/m  General appearance: alert, cooperative, appears stated age and morbidly obese Head: Normocephalic, without obvious abnormality, atraumatic Eyes: conjunctivae/corneas clear. PERRL, EOM's intact. Fundi benign. Ears: normal TM's and external ear canals both ears Nose: Nares normal. Septum midline. Mucosa normal. No drainage or sinus tenderness. Throat: lips, mucosa, and tongue normal; teeth and gums normal Neck: no adenopathy, no carotid bruit, no JVD, supple, symmetrical, trachea midline and thyroid not enlarged, symmetric, no tenderness/mass/nodules Back: symmetric, no curvature. ROM normal. No CVA tenderness. Lungs: clear to auscultation bilaterally Heart: regular rate and rhythm, S1, S2 normal, no murmur, click, rub or gallop Abdomen: soft, non-tender; bowel sounds normal; no masses,  no  organomegaly Extremities: extremities normal, atraumatic, no cyanosis or edema Pulses: 2+ and symmetric Skin: Skin color, texture, turgor normal. No rashes or lesions Lymph nodes: Cervical, supraclavicular, and axillary nodes normal. Neurologic: Alert and oriented X 3, normal strength and tone. Normal symmetric reflexes. Normal coordination and gait   .Marland Kitchen Depression screen [facility] 2/9 08/10/2020 08/17/2018 04/19/2018 07/13/2017  Decreased Interest 0 0 0 0  Down, Depressed, Hopeless 0 0 0 0  PHQ - 2 Score 0 0 0 0   .Marland KitchenNo flowsheet data found.    Assessment:    Healthy female exam.      Plan:     Marland KitchenMarland KitchenDiagnoses and all orders for this visit:  Routine physical examination  Need for influenza vaccination -     Flu Vaccine QUAD 6+ mos PF IM (Fluarix Quad PF)  Screening for diabetes mellitus -     COMPLETE METABOLIC PANEL WITH GFR -     Hemoglobin A1c  Screening for lipid disorders -     Lipid Panel w/reflex Direct LDL  Abnormal weight gain -     TSH  Class 3 severe obesity due to excess calories with serious comorbidity and body mass index (BMI) of 40.0 to 44.9 in adult (HCC) -     Semaglutide-Weight Management (WEGOVY) 0.25 MG/0.5ML SOAJ; Inject 0.5 mLs (0.25 mg total) into the skin once a week.   .. Discussed 150 minutes of exercise a week.  Encouraged vitamin D 1000 units and Calcium 1300mg  or 4 servings of dairy a day.  Mammogram UTD.  Colonoscopy UTD. No need for pap.  Vaccines UTD.  Fasting labs ordered.   .Discussed low carb diet with 1500 calories and 80g of protein.  Exercising at least 150 minutes a week.  My Fitness Pal could be a Marland Kitchen.  Chief Technology Officer started discussed side effects.  Watch out for pancreatitis symptoms. She has a hx of but due to her gallbladder and adhesions.  Follow up in 3 months.   Watch BP. Elevated today. Weight loss should help. Watch salt in diet.  See After Visit Summary for Counseling Recommendations

## 2020-08-14 LAB — COMPLETE METABOLIC PANEL WITH GFR
AG Ratio: 1.4 (calc) (ref 1.0–2.5)
ALT: 18 U/L (ref 6–29)
AST: 20 U/L (ref 10–35)
Albumin: 4 g/dL (ref 3.6–5.1)
Alkaline phosphatase (APISO): 72 U/L (ref 37–153)
BUN: 22 mg/dL (ref 7–25)
CO2: 28 mmol/L (ref 20–32)
Calcium: 9.4 mg/dL (ref 8.6–10.4)
Chloride: 105 mmol/L (ref 98–110)
Creat: 0.83 mg/dL (ref 0.50–0.99)
GFR, Est African American: 88 mL/min/{1.73_m2} (ref >=60)
GFR, Est Non African American: 76 mL/min/{1.73_m2} (ref >=60)
Globulin: 2.8 g/dL (calc) (ref 1.9–3.7)
Glucose, Bld: 89 mg/dL (ref 65–99)
Potassium: 4.7 mmol/L (ref 3.5–5.3)
Sodium: 139 mmol/L (ref 135–146)
Total Bilirubin: 0.5 mg/dL (ref 0.2–1.2)
Total Protein: 6.8 g/dL (ref 6.1–8.1)

## 2020-08-14 LAB — LIPID PANEL W/REFLEX DIRECT LDL
Cholesterol: 261 mg/dL — ABNORMAL HIGH (ref <200)
HDL: 96 mg/dL (ref >=50)
LDL Cholesterol (Calc): 153 mg/dL (calc) — ABNORMAL HIGH
Non-HDL Cholesterol (Calc): 165 mg/dL (calc) — ABNORMAL HIGH (ref <130)
Total CHOL/HDL Ratio: 2.7 (calc) (ref <5.0)
Triglycerides: 38 mg/dL (ref <150)

## 2020-08-14 LAB — HEMOGLOBIN A1C
Hgb A1c MFr Bld: 4.9 % of total Hgb (ref <5.7)
Mean Plasma Glucose: 94 (calc)
eAG (mmol/L): 5.2 (calc)

## 2020-08-14 LAB — TSH: TSH: 2.47 mIU/L (ref 0.40–4.50)

## 2020-08-14 NOTE — Progress Notes (Signed)
April Norman,   A1C looks great. Kidney, liver, glucose looks great.  Thyroid normal.  LDL not to goal but based on risk factors. 10 year CV risk is 3.8 percent and under the treatment guideline of 7.5 percent.

## 2020-08-20 ENCOUNTER — Other Ambulatory Visit: Payer: Self-pay | Admitting: Physician Assistant

## 2020-08-20 DIAGNOSIS — J45909 Unspecified asthma, uncomplicated: Secondary | ICD-10-CM

## 2020-09-07 ENCOUNTER — Other Ambulatory Visit: Payer: Self-pay | Admitting: Physician Assistant

## 2020-09-07 DIAGNOSIS — Z6841 Body Mass Index (BMI) 40.0 and over, adult: Secondary | ICD-10-CM

## 2020-09-13 ENCOUNTER — Telehealth: Payer: Self-pay | Admitting: Neurology

## 2020-09-13 MED ORDER — WEGOVY 0.5 MG/0.5ML ~~LOC~~ SOAJ: 0.5000 mg | SUBCUTANEOUS | 0 refills | Status: DC

## 2020-09-13 NOTE — Telephone Encounter (Signed)
Patient left vm not understanding why Wegovy 0.25 mg refill was denied. States she is doing well on medication. I called back and had to [facility]. Let her know Reginal Lutes titrates every month. She will need to touch base with [place] via phone or mychart to let us know how she is doing and we will send in the next dosage. Told her to call with any questions. RX for 0.5 mg sent to pharmacy.

## 2020-09-14 NOTE — Telephone Encounter (Signed)
Patient did call back and leave vm that she is doing fantastic. Lost 12 lbs. Helping her appetite. RX already sent for next dose.

## 2020-11-09 ENCOUNTER — Encounter: Payer: Self-pay | Admitting: Physician Assistant

## 2020-11-09 ENCOUNTER — Other Ambulatory Visit: Payer: Self-pay

## 2020-11-09 ENCOUNTER — Ambulatory Visit: Payer: PRIVATE HEALTH INSURANCE | Admitting: Physician Assistant

## 2020-11-09 VITALS — BP 133/71 | HR 91 | Ht 60.0 in | Wt 198.0 lb

## 2020-11-09 DIAGNOSIS — E6609 Other obesity due to excess calories: Secondary | ICD-10-CM | POA: Diagnosis not present

## 2020-11-09 DIAGNOSIS — Z6838 Body mass index (BMI) 38.0-38.9, adult: Secondary | ICD-10-CM | POA: Diagnosis not present

## 2020-11-09 MED ORDER — WEGOVY 0.5 MG/0.5ML ~~LOC~~ SOAJ: 0.5000 mg | SUBCUTANEOUS | 0 refills | Status: DC

## 2020-11-09 NOTE — Progress Notes (Signed)
Subjective:    Patient ID: April Norman, female    DOB: 04/04/1956, 64 y.o.   MRN: [id number]  HPI  Pt is a 64 yo obese female who presents to the clinic to discuss weight today. She was started on wegovy back in august.  She did extremely well on we gave you for the first month.  She denies any nausea, vomiting, epigastric pain.  She lost 19 pounds by her scales.  She has been off of it for 2 months because she has not been able to get it refilled.  She still is down 8 pounds from her last weight.  She would like to continue this therapy and figure out how to get it covered.  She is trying to stay more active and work on portion sizes.  .. Active Ambulatory Problems    Diagnosis Date Noted  . Rheumatoid arthritis (HCC) 06/05/2014  . Asthma 06/05/2014  . Osteopenia 08/01/2014  . Asthma, chronic 01/02/2015  . Hyperplastic colon polyp 01/02/2015  . Nodule of external ear 10/12/2015  . Seborrheic dermatitis 10/12/2015  . Bilateral foot pain 10/15/2015  . Vitamin D deficiency 07/03/2017  . Elevated LDL cholesterol level 07/13/2017  . Acute telogen effluvium 01/15/2018  . Eczema 04/19/2018  . Class 2 obesity due to excess calories without serious comorbidity with body mass index (BMI) of 38.0 to 38.9 in adult 08/17/2018  . Eosinophilic esophagitis 09/14/2018  . Cough 12/06/2019  . Abnormal weight gain 08/10/2020  . Elevated blood pressure reading 08/10/2020   Resolved Ambulatory Problems    Diagnosis Date Noted  . Posterior tibial tendonitis 12/31/2015  . Close exposure to COVID-19 virus 12/06/2019   Past Medical History:  Diagnosis Date  . Vertigo, labyrinthine         Review of Systems  All other systems reviewed and are negative.      Objective:   Physical Exam Vitals reviewed.  Constitutional:      Appearance: Normal appearance.  Cardiovascular:     Rate and Rhythm: Normal rate and regular rhythm.     Pulses: Normal pulses.  Pulmonary:     Effort: Pulmonary  effort is normal.     Breath sounds: Normal breath sounds.  Neurological:     General: No focal deficit present.     Mental Status: She is alert and oriented to person, place, and time.  Psychiatric:        Mood and Affect: Mood normal.           Assessment & Plan:  Marland KitchenMarland KitchenNalanie was seen today for weight check.  Diagnoses and all orders for this visit:  Class 2 obesity due to excess calories without serious comorbidity with body mass index (BMI) of 38.0 to 38.9 in adult -     Semaglutide-Weight Management (WEGOVY) 0.5 MG/0.5ML SOAJ; Inject 0.5 mg into the skin once a week.   Patient had an amazing response in the first month down 19 pounds to Ajovy.  Of course as expected she has steadily gained weight over the last 2 months and only down 8 pounds today.  Would like to see if she can do the full 6 months with insurance card.  Told to use [employer].  Discussed potential side effects.  Follow-up in 3 months.  Keep calling in every month to get higher dose.  Let us know if she has any problems getting this medication.  Of course this medication is always good as the lifestyle changes you can implement into your  daily routine.  Drink lots of water make good food choices watch portion sizes walk daily.

## 2020-11-26 ENCOUNTER — Other Ambulatory Visit: Payer: Self-pay | Admitting: Neurology

## 2020-11-26 MED ORDER — WEGOVY 1 MG/0.5ML ~~LOC~~ SOAJ: 1.0000 mg | SUBCUTANEOUS | 0 refills | Status: DC

## 2020-11-26 NOTE — Telephone Encounter (Signed)
Patient left vm stating she is doing well on Wegovy, she is losing weight. She does feel like the first couple of days after taking the injection she feels a little sick to her stomach, but this goes away. Please sign RX if okay to increase to 1 mg dosage.

## 2020-11-28 NOTE — Telephone Encounter (Signed)
LMOM making patient aware. RX sent to pharmacy.  

## 2020-12-17 ENCOUNTER — Other Ambulatory Visit: Payer: Self-pay | Admitting: Physician Assistant

## 2020-12-17 DIAGNOSIS — Z1231 Encounter for screening mammogram for malignant neoplasm of breast: Secondary | ICD-10-CM

## 2020-12-24 ENCOUNTER — Encounter: Payer: Self-pay | Admitting: Physician Assistant

## 2020-12-24 ENCOUNTER — Telehealth (INDEPENDENT_AMBULATORY_CARE_PROVIDER_SITE_OTHER): Payer: PRIVATE HEALTH INSURANCE | Admitting: Physician Assistant

## 2020-12-24 VITALS — Temp 98.2°F

## 2020-12-24 DIAGNOSIS — J4541 Moderate persistent asthma with (acute) exacerbation: Secondary | ICD-10-CM

## 2020-12-24 MED ORDER — METHYLPREDNISOLONE 4 MG PO TBPK: ORAL_TABLET | ORAL | 0 refills | Status: DC

## 2020-12-24 MED ORDER — AZITHROMYCIN 250 MG PO TABS
ORAL_TABLET | ORAL | 0 refills | Status: DC
Start: 2020-12-24 — End: 2021-03-26

## 2020-12-24 NOTE — Progress Notes (Unsigned)
..Virtual Visit via Telephone Note  I connected with April Norman on 12/24/2020 at  1:00 PM EST by telephone and verified that I am speaking with the correct person using two identifiers.  Location: Patient: home Provider: home  .Marland KitchenParticipating in visit:  Patient: April Norman Provider: Tandy Gaw PA-C   I discussed the limitations, risks, security and privacy concerns of performing an evaluation and management service by telephone and the availability of in person appointments. I also discussed with the patient that there may be a patient responsible charge related to this service. The patient expressed understanding and agreed to proceed.   History of Present Illness: Pt is a 65 yo female with Asthma and RA who calls into the clinic with chest tightness and productive cough for about 5 days. She has hx of similar symptoms at least twice a year. Denies any fever, body aches, loss of smell or taste, GI symptoms, ST, headache, sinus pressure. She is using her asmanex and albuterol inhaler which does help. Her chest continues to get tighter and tighter. She cannot take prednisone. She has tolerated solu-medrol IM injections in the office before. She has flu and covid vaccine. No other sick contacts.    .. Active Ambulatory Problems    Diagnosis Date Noted  . Rheumatoid arthritis (HCC) 06/05/2014  . Asthma 06/05/2014  . Osteopenia 08/01/2014  . Asthma, chronic 01/02/2015  . Hyperplastic colon polyp 01/02/2015  . Nodule of external ear 10/12/2015  . Seborrheic dermatitis 10/12/2015  . Bilateral foot pain 10/15/2015  . Vitamin D deficiency 07/03/2017  . Elevated LDL cholesterol level 07/13/2017  . Acute telogen effluvium 01/15/2018  . Eczema 04/19/2018  . Class 2 obesity due to excess calories without serious comorbidity with body mass index (BMI) of 38.0 to 38.9 in adult 08/17/2018  . Eosinophilic esophagitis 09/14/2018  . Cough 12/06/2019  . Abnormal weight gain 08/10/2020  .  Elevated blood pressure reading 08/10/2020   Resolved Ambulatory Problems    Diagnosis Date Noted  . Posterior tibial tendonitis 12/31/2015  . Close exposure to COVID-19 virus 12/06/2019   Past Medical History:  Diagnosis Date  . Vertigo, labyrinthine    Reviewed med, allergy, problem list.     Observations/Objective: No acute distress Normal breathing on phone. Productive cough.  .. Today's Vitals   12/24/20 1318  Temp: 98.2 F (36.8 C)  TempSrc: Oral   There is no height or weight on file to calculate BMI.    Assessment and Plan: Marland KitchenMarland KitchenDiagnoses and all orders for this visit:  Moderate persistent asthmatic bronchitis with acute exacerbation -     methylPREDNISolone (MEDROL DOSEPAK) 4 MG TBPK tablet; Take as directed by package insert. -     azithromycin (ZITHROMAX Z-PAK) 250 MG tablet; Take 2 tablets (500 mg) on  Day 1,  followed by 1 tablet (250 mg) once daily on Days 2 through 5.   Likely viral infection exacerbating asthma. Cannot tolerate prednisone. She has tolerated solu medrol shot in office. Sent medrol dose pack. Discussed with patient to still monitor closely for any allergy symptoms with solu medrol. Due to immunocompromised state and hx will send zpak for infection. Continue symptomatic care.  No symptoms to suspect flu or strep.  Pt could consider covid testing it would be a mild case. It has been 5 days and she can work from home. Ok to wear a mask. Certainly if breathing is worsening could get tested. She is vaccinated.     Follow Up Instructions:  I discussed the assessment and treatment plan with the patient. The patient was provided an opportunity to ask questions and all were answered. The patient agreed with the plan and demonstrated an understanding of the instructions.   The patient was advised to call back or seek an in-person evaluation if the symptoms worsen or if the condition fails to improve as anticipated.  I provided 10 minutes of  non-face-to-face time during this encounter.   Tandy Gaw, PA-C

## 2020-12-25 ENCOUNTER — Encounter: Payer: Self-pay | Admitting: Physician Assistant

## 2021-01-24 ENCOUNTER — Telehealth: Payer: Self-pay | Admitting: Neurology

## 2021-01-24 NOTE — Telephone Encounter (Signed)
Prior Authorization for Asmanex submitted via covermymeds.  ASMANEX 220MCG(60) AER POW BA is not covered for this Member. For formulary alternatives, please view the Standard or [employer] Formulary, whichever this Member is covered by, at [email or web address] or call us at [phone].  Alternatives are:  Fluticasone-Salmeterol Spiriva Respimat Flovent HFA Pulmicort Flexhaler Arnuity Ellipta Flovent Diskus Symbicort    Please advise.

## 2021-01-25 MED ORDER — BUDESONIDE-FORMOTEROL FUMARATE 160-4.5 MCG/ACT IN AERO: 2.0000 | INHALATION_SPRAY | Freq: Two times a day (BID) | RESPIRATORY_TRACT | 5 refills | Status: DC

## 2021-01-25 NOTE — Addendum Note (Signed)
Addended by: Jomarie Longs on: 01/25/2021 09:42 AM   Modules accepted: Orders

## 2021-01-25 NOTE — Telephone Encounter (Signed)
Patient made aware, will call with any issues.

## 2021-01-25 NOTE — Telephone Encounter (Signed)
Sent symbicort

## 2021-01-30 ENCOUNTER — Other Ambulatory Visit: Payer: Self-pay

## 2021-01-30 ENCOUNTER — Ambulatory Visit (INDEPENDENT_AMBULATORY_CARE_PROVIDER_SITE_OTHER): Payer: PRIVATE HEALTH INSURANCE

## 2021-01-30 DIAGNOSIS — Z1231 Encounter for screening mammogram for malignant neoplasm of breast: Secondary | ICD-10-CM | POA: Diagnosis not present

## 2021-02-04 NOTE — Progress Notes (Signed)
Normal mammogram. Follow up in 1 year.

## 2021-02-06 ENCOUNTER — Telehealth: Payer: Self-pay | Admitting: Neurology

## 2021-02-06 NOTE — Telephone Encounter (Signed)
Patient left vm stating she tested positive for Covid, wanted to know if she needs any medication and if she needs to reschedule her appt on Friday.   Patient made aware she does need to reschedule. Appt cancelled, she will call back.   She has mild symptoms, some congestion and a headache. She is managing symptoms currently. She will let us know if anything worsens.

## 2021-02-08 ENCOUNTER — Ambulatory Visit: Payer: PRIVATE HEALTH INSURANCE | Admitting: Physician Assistant

## 2021-02-11 ENCOUNTER — Telehealth: Payer: Self-pay | Admitting: *Deleted

## 2021-02-11 ENCOUNTER — Ambulatory Visit: Payer: PRIVATE HEALTH INSURANCE | Admitting: Physician Assistant

## 2021-02-11 NOTE — Telephone Encounter (Signed)
Pt called this morning complaining that the chest congestion from her having Covid last week just won't go away and she's tried everything OTC.  Let's get her set up with a virtual please.

## 2021-02-12 NOTE — Telephone Encounter (Signed)
LVM for patient to call back to get virtual appt scheduled. AM

## 2021-02-12 NOTE — Telephone Encounter (Signed)
Yes virtual appt would be what is needed.

## 2021-03-26 ENCOUNTER — Encounter: Payer: Self-pay | Admitting: Physician Assistant

## 2021-03-26 ENCOUNTER — Other Ambulatory Visit: Payer: Self-pay

## 2021-03-26 ENCOUNTER — Ambulatory Visit (INDEPENDENT_AMBULATORY_CARE_PROVIDER_SITE_OTHER): Payer: PRIVATE HEALTH INSURANCE | Admitting: Physician Assistant

## 2021-03-26 VITALS — BP 155/64 | HR 90 | Ht 60.0 in | Wt 204.0 lb

## 2021-03-26 DIAGNOSIS — J4540 Moderate persistent asthma, uncomplicated: Secondary | ICD-10-CM

## 2021-03-26 DIAGNOSIS — R030 Elevated blood-pressure reading, without diagnosis of hypertension: Secondary | ICD-10-CM

## 2021-03-26 DIAGNOSIS — E669 Obesity, unspecified: Secondary | ICD-10-CM | POA: Diagnosis not present

## 2021-03-26 MED ORDER — BUDESONIDE 180 MCG/ACT IN AEPB: 1.0000 | INHALATION_SPRAY | Freq: Two times a day (BID) | RESPIRATORY_TRACT | 5 refills | Status: DC

## 2021-03-26 MED ORDER — TOPIRAMATE 50 MG PO TABS: ORAL_TABLET | ORAL | 0 refills | Status: DC

## 2021-03-26 NOTE — Progress Notes (Signed)
Subjective:    Patient ID: April Norman, female    DOB: [birth year], 65 y.o.   MRN: [id number]  HPI  Patient is a 65 year old obese female with RA, hypertension, asthma who presents to the clinic to discuss weight.  She has lost weight on wegovy but cannot tolerate due to GI upset, nausea, vomiting, abdominal pain.  She would like to try something else for weight loss.  She admits she is not walking and eating like she should.  Patient is not on anything for blood pressure.  She denies any chest pain, palpitations, headaches, vision changes, dizziness.  Her insurance will not pay for Asmanex any longer.  She denies substitute.   .. Active Ambulatory Problems    Diagnosis Date Noted  . Rheumatoid arthritis (HCC) 06/05/2014  . Asthma 06/05/2014  . Osteopenia 08/01/2014  . Asthma, chronic 01/02/2015  . Hyperplastic colon polyp 01/02/2015  . Nodule of external ear 10/12/2015  . Seborrheic dermatitis 10/12/2015  . Bilateral foot pain 10/15/2015  . Vitamin D deficiency 07/03/2017  . Elevated LDL cholesterol level 07/13/2017  . Acute telogen effluvium 01/15/2018  . Eczema 04/19/2018  . Class 2 obesity due to excess calories without serious comorbidity with body mass index (BMI) of 38.0 to 38.9 in adult 08/17/2018  . Eosinophilic esophagitis 09/14/2018  . Cough 12/06/2019  . Abnormal weight gain 08/10/2020  . Elevated blood pressure reading 08/10/2020  . Obesity (BMI 35.0-39.9 without comorbidity) 03/26/2021   Resolved Ambulatory Problems    Diagnosis Date Noted  . Posterior tibial tendonitis 12/31/2015  . Close exposure to COVID-19 virus 12/06/2019   Past Medical History:  Diagnosis Date  . Vertigo, labyrinthine     Review of Systems    see HPI.  Objective:   Physical Exam Vitals reviewed.  Constitutional:      Appearance: Normal appearance. She is obese.  Cardiovascular:     Rate and Rhythm: Normal rate and regular rhythm.     Pulses: Normal pulses.     Heart  sounds: Normal heart sounds.  Pulmonary:     Effort: Pulmonary effort is normal.     Breath sounds: Normal breath sounds.  Neurological:     General: No focal deficit present.     Mental Status: She is alert and oriented to person, place, and time.  Psychiatric:        Mood and Affect: Mood normal.           Assessment & Plan:  Marland KitchenMarland KitchenFannye was seen today for follow-up.  Diagnoses and all orders for this visit:  Obesity (BMI 35.0-39.9 without comorbidity) -     topiramate (TOPAMAX) 50 MG tablet; One half tab by mouth daily for a week, then one tab by mouth daily.  Moderate persistent asthma without complication -     budesonide (PULMICORT) 180 MCG/ACT inhaler; Inhale 1-2 puffs into the lungs 2 (two) times daily.  Elevated blood pressure reading    Stop asmanex and try Pulmicort. Discussed how to take.   Marland Kitchen.Discussed low carb diet with 1500 calories and 80g of protein.  Exercising at least 150 minutes a week.  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  Failed wegovy due to GI side effects.   Not candidate for phentermine.  Started topamax. Discussed side effects.   Blood pressure is elevated at office today.  Discussed needs to be below 150/90.  Would like for patient to start checking this at home.  We will recheck in 3 months.  May need  to consider medication.  Hopefully weight loss could help with this as far as a low-salt diet.   Spent 30 minutes with patients discussing weight, medications, goals, side effects.

## 2021-03-26 NOTE — Patient Instructions (Addendum)
optivia   Phentermine; Topiramate extended-release capsules What is this medicine? Phentermine; Topiramate (FEN ter meen; Toe PYRE a mate) is a combination of two drugs that decrease appetite and help you lose weight. This product is used with a reduced calorie diet and exercise. This product can also help you maintain weight loss. This medicine may be used for other purposes; ask your health care provider or pharmacist if you have questions. COMMON BRAND NAME(S): Qsymia What should I tell my health care provider before I take this medicine? They need to know if you have any of these conditions:  bone problems  depression or other mental illness  diabetes  diarrhea  glaucoma  having surgery  heart disease  high blood pressure  history of drug abuse or alcohol abuse problem  history of heart attack or stroke  history of irregular heartbeat  kidney disease or stones  liver disease  low levels of potassium in the blood  lung or breathing disease, like asthma  metabolic acidosis  on a ketogenic diet  seizures  suicidal thoughts, plans, or attempt; a previous suicide attempt by you or a family member  taken an MAOI like Carbex, Eldepryl, Marplan, Nardil, or Parnate in last 14 days  thyroid disease  an unusual or allergic reaction to phentermine, topiramate, other medicines, foods, dyes, or preservatives  pregnant or trying to get pregnant  breast-feeding How should I use this medicine? Take this medicine by mouth with a glass of water. Follow the directions on the prescription label. Do not crush or chew. This medicine is usually taken with or without food once per day in the morning. Avoid taking this medicine in the evening. It may interfere with sleep. Take your doses at regular intervals. Do not take your medicine more often than directed. A special MedGuide will be given to you by the pharmacist with each prescription and refill. Be sure to read this  information carefully each time. Talk to your pediatrician regarding the use of this medicine in children. Special care may be needed. Overdosage: If you think you have taken too much of this medicine contact a poison control center or emergency room at once. NOTE: This medicine is only for you. Do not share this medicine with others. What if I miss a dose? If you miss a dose, skip it. Take your next dose at the normal time. Do not take extra or 2 doses at the same time to make up for the missed dose. What may interact with this medicine? Do not take this medicine with any of the following medications:  MAOIs like Carbex, Eldepryl, Marplan, Nardil, and Parnate This medicine may also interact with the following medications:  acetazolamide  alcohol  antihistamines for allergy, cough and cold  atropine  birth control pills  carbamazepine  certain medicines for bladder problems like oxybutynin, tolterodine  certain medicines for depression, anxiety, or psychotic disturbances  certain medicines for high blood pressure  certain medicines for Parkinson's disease like benztropine, trihexyphenidyl  certain medicines for sleep  certain medicines for stomach problems like dicyclomine, hyoscyamine  certain medicines for travel sickness like scopolamine  dichlorphenamide  digoxin  diuretics  linezolid  medicines for colds or breathing difficulties like pseudoephedrine or phenylephrine  medicines for diabetes  methazolamide  narcotic medicines for pain  phenytoin  sibutramine  stimulant medicines for attention disorders, weight loss, or to stay awake  valproic acid  zonisamide This list may not describe all possible interactions. Give your health care provider  a list of all the medicines, herbs, non-prescription drugs, or dietary supplements you use. Also tell them if you smoke, drink alcohol, or use illegal drugs. Some items may interact with your medicine. What  should I watch for while using this medicine? Visit your doctor or health care provider for regular checks on your progress. Do not stop taking except on your health care provider's advice. You may develop a severe reaction. Your health care provider will tell you how much medicine to take. Do not take this medicine close to bedtime. It may prevent you from sleeping. Avoid extreme heat. This medicine can cause you to sweat less than normal. Your body temperature could increase to dangerous levels, which may lead to heat stroke. You should drink plenty of fluids while taking this medicine. If you have had kidney stones in the past, this will help to reduce your chances of forming kidney stones. You may get drowsy or dizzy. Do not drive, use machinery, or do anything that needs mental alertness until you know how this medicine affects you. Do not stand or sit up quickly, especially if you are an older patient. This reduces the risk of dizzy or fainting spells. Alcohol may increase dizziness and drowsiness. Avoid alcoholic drinks. This medicine may affect blood sugar levels. Ask your healthcare provider if changes in diet or medicines are needed if you have diabetes. Check with your health care professional if you have severe diarrhea, nausea, and vomiting, or if you sweat a lot. The loss of too much body fluid may make it dangerous for you to take this medicine. Tell your health care professional right away if you have any change in your eyesight. Patients and their families should watch out for new or worsening depression or thoughts of suicide. Also watch out for sudden changes in feelings such as feeling anxious, agitated, panicky, irritable, hostile, aggressive, impulsive, severely restless, overly excited and hyperactive, or not being able to sleep. If this happens, especially at the beginning of treatment or after a change in dose, call your healthcare professional. This medicine may cause serious  skin reactions. They can happen weeks to months after starting the medicine. Contact your health care provider right away if you notice fevers or flu-like symptoms with a rash. The rash may be red or purple and then turn into blisters or peeling of the skin. Or, you might notice a red rash with swelling of the face, lips or lymph nodes in your neck or under your arms. Birth control may not work properly while you are taking this medicine. Talk to your health care professional about using an extra method of birth control. Women should inform their health care provider if they wish to become pregnant or think they might be pregnant. There is a potential for serious side effects and harm to an unborn child. Losing weight while pregnant is not advised and may cause harm to the unborn child. Talk to your health care provider for more information. What side effects may I notice from receiving this medicine? Side effects that you should report to your doctor or health care professional as soon as possible:  allergic reactions like skin rash, itching or hives, swelling of the face, lips, or tongue  anxious  breathing problems  changes in vision  chest pain or chest tightness  depressed mood or other mood changes  fast, irregular heartbeat  increased blood pressure  irritable  pain, tingling, numbness in the hands or feet  redness, blistering, peeling  or loosening of the skin, including inside the mouth  restlessness  seizures  signs and symptoms of increased acid in the body like breathing fast; fast heartbeat; headache; confusion; unusually weak or tired; nausea, vomiting  signs and symptoms of kidney stones like blood in urine; pain in the lower back or side; pain when urinating  signs and symptoms of a stroke like changes in vision; confusion; trouble speaking or understanding; severe headaches; sudden numbness or weakness of the face, arm or leg; trouble walking; dizziness; loss of  balance or coordination  suicidal thoughts  trouble passing urine or change in the amount of urine Side effects that usually do not require medical attention (report to your doctor or health care professional if they continue or are bothersome):  changes in taste  constipation  dizziness  dry mouth  headache  trouble sleeping This list may not describe all possible side effects. Call your doctor for medical advice about side effects. You may report side effects to FDA at 1-800-FDA-1088. Where should I keep my medicine? Keep out of the reach of children. This medicine can be abused. Keep your medicine in a safe place to protect it from theft. Do not share this medicine with anyone. Selling or giving away this medicine is dangerous and against the law. This medicine may cause harm and death if it is taken by other adults, children, or pets. Return medicine that has not been used to an official disposal site. Contact the DEA at 970-507-4166 or your city/county government to find a site. If you cannot return the medicine, mix any unused medicine with a substance like cat litter or coffee grounds. Then throw the medicine away in a sealed container like a sealed bag or coffee can with a lid. Do not use the medicine after the expiration date. Store at room temperature between 15 and 25 degrees C (59 and 77 degrees F). NOTE: This sheet is a summary. It may not cover all possible information. If you have questions about this medicine, talk to your doctor, pharmacist, or health care provider.  2021 Elsevier/Gold Standard (2019-10-14 12:44:21)   Bupropion; Naltrexone extended-release tablets What is this medicine? BUPROPION; NALTREXONE (byoo PROE pee on; nal TREX one) is a combination of two drugs that help you lose weight. This product is used with a reduced calorie diet and exercise. This product can also help you maintain weight loss. This medicine may be used for other purposes; ask your  health care provider or pharmacist if you have questions. COMMON BRAND NAME(S): Contrave What should I tell my health care provider before I take this medicine? They need to know if you have any of these conditions:  an eating disorder, such as anorexia or bulimia  diabetes  depression  glaucoma  head injury  heart disease  high blood pressure  history of drug abuse or alcohol abuse problem  history of a tumor or infection of your brain or spine  history of heart attack or stroke  history of irregular heartbeat  if you often drink alcohol  kidney disease  liver disease  low levels of sodium in the blood  mental illness  seizures  suicidal thoughts, plans, or attempt; a previous suicide attempt by you or a family member  taken an MAOI like Carbex, Eldepryl, Marplan, Nardil, or Parnate in last 14 days  an unusual or allergic reaction to bupropion, naltrexone, other medicines, foods, dyes, or preservatives  breast-feeding  pregnant or trying to become pregnant How should  I use this medicine? Take this medicine by mouth with a glass of water. Follow the directions on the prescription label. Do not cut, crush or chew this medicine. Swallow the tablets whole. You can take it with or without food. Do not take with high-fat meals as this may increase your risk of seizures. Take your medicine at regular intervals. Do not take it more often than directed. Do not stop taking except on your doctor's advice. A special MedGuide will be given to you by the pharmacist with each prescription and refill. Be sure to read this information carefully each time. Talk to your pediatrician regarding the use of this medicine in children. Special care may be needed. Overdosage: If you think you have taken too much of this medicine contact a poison control center or emergency room at once. NOTE: This medicine is only for you. Do not share this medicine with others. What if I miss a  dose? If you miss a dose, skip it. Take your next dose at the normal time. Do not take extra or 2 doses at the same time to make up for the missed dose. What may interact with this medicine? Do not take this medicine with any of the following medications:  any medicines used to stop taking opioids such as methadone or buprenorphine  linezolid  MAOIs like Carbex, Eldepryl, Marplan, Nardil, and Parnate  methylene blue (injected into a vein)  often take narcotic medicines for pain or cough  other medicines that contain bupropion like Zyban or Wellbutrin This medicine may also interact with the following medications:  alcohol  certain medicines for blood pressure like metoprolol, propranolol  certain medicines for depression, anxiety, or psychotic disturbances  certain medicines for HIV or hepatitis  certain medicines for irregular heart beat like propafenone, flecainide  certain medicines for Parkinson's disease like amantadine, levodopa  certain medicines for seizures like carbamazepine, phenytoin, phenobarbital  certain medicines for sleep  cimetidine  clopidogrel  cyclophosphamide  digoxin  disulfiram  furazolidone  isoniazid  nicotine  orphenadrine  procarbazine  steroid medicines like prednisone or cortisone  stimulant medicines for attention disorders, weight loss, or to stay awake  tamoxifen  theophylline  thiotepa  ticlopidine  tramadol  warfarin This list may not describe all possible interactions. Give your health care provider a list of all the medicines, herbs, non-prescription drugs, or dietary supplements you use. Also tell them if you smoke, drink alcohol, or use illegal drugs. Some items may interact with your medicine. What should I watch for while using this medicine? Visit your doctor or healthcare provider for regular checks on your progress. This medicine may cause serious skin reactions. They can happen weeks to months after  starting the medicine. Contact your healthcare provider right away if you notice fevers or flu-like symptoms with a rash. The rash may be red or purple and then turn into blisters or peeling of the skin. Or, you might notice a red rash with swelling of the face, lips or lymph nodes in your neck or under your arms. This medicine may affect blood sugar. Ask your healthcare provider if changes in diet or medicines are needed if you have diabetes. Patients and their families should watch out for new or worsening depression or thoughts of suicide. Also watch out for sudden changes in feelings such as feeling anxious, agitated, panicky, irritable, hostile, aggressive, impulsive, severely restless, overly excited and hyperactive, or not being able to sleep. If this happens, especially at the beginning of  treatment or after a change in dose, call your healthcare provider. Avoid alcoholic drinks while taking this medicine. Drinking large amounts of alcoholic beverages, using sleeping or anxiety medicines, or quickly stopping the use of these agents while taking this medicine may increase your risk for a seizure. Do not drive or use heavy machinery until you know how this medicine affects you. This medicine can impair your ability to perform these tasks. Women should inform their health care provider if they wish to become pregnant or think they might be pregnant. Losing weight while pregnant is not advised and may cause harm to the unborn child. Talk to your health care provider for more information. What side effects may I notice from receiving this medicine? Side effects that you should report to your doctor or health care professional as soon as possible:  allergic reactions like skin rash, itching or hives, swelling of the face, lips, or tongue  breathing problems  changes in vision  confusion  elevated mood, decreased need for sleep, racing thoughts, impulsive behavior  fast or irregular  heartbeat  hallucinations, loss of contact with reality  increased blood pressure  rash, fever, and swollen lymph nodes  redness, blistering, peeling, or loosening of the skin, including inside the mouth  seizures  signs and symptoms of liver injury like dark yellow or brown urine; general ill feeling or flu-like symptoms; light-colored stools; loss of appetite; nausea; right upper belly pain; unusually weak or tired; yellowing of the eyes or skin  suicidal thoughts or other mood changes  vomiting Side effects that usually do not require medical attention (report to your doctor or health care professional if they continue or are bothersome):  constipation  headache  loss of appetite  indigestion, stomach upset  tremors This list may not describe all possible side effects. Call your doctor for medical advice about side effects. You may report side effects to FDA at 1-800-FDA-1088. Where should I keep my medicine? Keep out of the reach of children. Store at room temperature between 15 and 30 degrees C (59 and 86 degrees F). Throw away any unused medicine after the expiration date. NOTE: This sheet is a summary. It may not cover all possible information. If you have questions about this medicine, talk to your doctor, pharmacist, or health care provider.  2021 Elsevier/Gold Standard (2019-10-14 93:81:01)

## 2021-04-24 ENCOUNTER — Other Ambulatory Visit: Payer: Self-pay | Admitting: Physician Assistant

## 2021-04-24 DIAGNOSIS — E669 Obesity, unspecified: Secondary | ICD-10-CM

## 2021-04-26 ENCOUNTER — Ambulatory Visit: Payer: PRIVATE HEALTH INSURANCE | Admitting: Physician Assistant

## 2021-04-26 ENCOUNTER — Other Ambulatory Visit: Payer: Self-pay

## 2021-04-26 VITALS — BP 138/78 | HR 92 | Ht 60.0 in | Wt 196.0 lb

## 2021-04-26 DIAGNOSIS — R21 Rash and other nonspecific skin eruption: Secondary | ICD-10-CM

## 2021-04-26 DIAGNOSIS — Z6838 Body mass index (BMI) 38.0-38.9, adult: Secondary | ICD-10-CM | POA: Diagnosis not present

## 2021-04-26 MED ORDER — PHENTERMINE HCL 37.5 MG PO TABS: 37.5000 mg | ORAL_TABLET | Freq: Every day | ORAL | 0 refills | Status: DC

## 2021-04-26 MED ORDER — CLOBETASOL PROPIONATE 0.05 % EX CREA: 1.0000 "application " | TOPICAL_CREAM | Freq: Two times a day (BID) | CUTANEOUS | 0 refills | Status: DC

## 2021-04-26 NOTE — Progress Notes (Signed)
Subjective:    Patient ID: April Norman, female    DOB: [birth year], 65 y.o.   MRN: [id number]  HPI  Patient is a 65 year old female with rheumatoid arthritis, asthma, ascending polyp esophagitis who presents to the clinic with bilateral lower leg rash for the last 2 weeks.  She denies that the rash itches or hurts.  She denies any fever, chills, headache, bug bites, nausea, vomiting, urinary changes.  She has been putting some hydrocortisone cream with no benefit.  It seems to be traveling up her leg a bit.  She is going to the beach in 2 weeks and she really wants this cleared up.  She has never had anything like this before.  The only new medication she has started was about 4 weeks ago Topamax.  She does not like this medicine.  Is been causing some word finding difficulty.  She is using it for weight loss.  She would like to talk about weight loss.  She has lost 8 pounds over the past month but would like to try phentermine again.  She has done this in the past and lost weight.  She cannot tolerate Wegovy.  .. Active Ambulatory Problems    Diagnosis Date Noted  . Rheumatoid arthritis (HCC) 06/05/2014  . Asthma 06/05/2014  . Osteopenia 08/01/2014  . Asthma, chronic 01/02/2015  . Hyperplastic colon polyp 01/02/2015  . Nodule of external ear 10/12/2015  . Seborrheic dermatitis 10/12/2015  . Bilateral foot pain 10/15/2015  . Vitamin D deficiency 07/03/2017  . Elevated LDL cholesterol level 07/13/2017  . Acute telogen effluvium 01/15/2018  . Eczema 04/19/2018  . Class 2 severe obesity due to excess calories with serious comorbidity and body mass index (BMI) of 38.0 to 38.9 in adult (HCC) 08/17/2018  . Eosinophilic esophagitis 09/14/2018  . Cough 12/06/2019  . Abnormal weight gain 08/10/2020  . Elevated blood pressure reading 08/10/2020  . Obesity (BMI 35.0-39.9 without comorbidity) 03/26/2021  . Rash and nonspecific skin eruption 04/26/2021   Resolved Ambulatory Problems     Diagnosis Date Noted  . Posterior tibial tendonitis 12/31/2015  . Close exposure to COVID-19 virus 12/06/2019   Past Medical History:  Diagnosis Date  . Vertigo, labyrinthine      Review of Systems See HPI.     Objective:   Physical Exam Constitutional:      Appearance: She is obese.  Cardiovascular:     Rate and Rhythm: Normal rate and regular rhythm.     Pulses: Normal pulses.  Pulmonary:     Effort: Pulmonary effort is normal.          maculopapular diffuse rash that is more firm papules of both lower legs. Non-blanchable. No tenderness or itching. No swelling or warmth to touch. Rash worse around ankles and scatters as it moves up legs. Spares feet. (trouble loading picture will make addendum if can get pic loaded) Assessment & Plan:  Marland KitchenMarland KitchenMyelle was seen today for rash.  Diagnoses and all orders for this visit:  Rash and nonspecific skin eruption -     clobetasol cream (TEMOVATE) 0.05 %; Apply 1 application topically 2 (two) times daily.  Class 2 severe obesity due to excess calories with serious comorbidity and body mass index (BMI) of 38.0 to 38.9 in adult (HCC) -     phentermine (ADIPEX-P) 37.5 MG tablet; Take 1 tablet (37.5 mg total) by mouth daily before breakfast.  Other orders -     Discontinue: phentermine (ADIPEX-P) 37.5 MG tablet; Take 1 tablet (  37.5 mg total) by mouth daily before breakfast.    Unclear etiology of rash. It does not hurt or itch. Does not appear viral, fungual or bacterial. Does not appear like drug reaction. More likely and dermatitis that does not itch to patient.  Consulted Dr. Judie Petit and suggested stronger topical steroid and if not improving next week biopsy.   Marland Kitchen.Discussed low carb diet with 1500 calories and 80g of protein.  Exercising at least 150 minutes a week.  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  Stop topamax.  Start phentermine at patients request.  Follow up in 45months.       Spent 40 minutes with patient discussing  plan and thoughts, reviewing chart and consulting other providers.

## 2021-04-29 ENCOUNTER — Encounter: Payer: Self-pay | Admitting: Physician Assistant

## 2021-04-29 ENCOUNTER — Telehealth: Payer: Self-pay

## 2021-04-29 NOTE — Telephone Encounter (Signed)
Patient is scheduled on Wednesday at 2:00- [facility]

## 2021-04-29 NOTE — Telephone Encounter (Signed)
April Norman called and stated that her legs are not getting better and the steroid cream hasn't helped. Please advise, thanks.

## 2021-04-29 NOTE — Telephone Encounter (Signed)
Bring her in for biospy 40 minute slot as soon as possible.

## 2021-05-01 ENCOUNTER — Other Ambulatory Visit: Payer: Self-pay | Admitting: Physician Assistant

## 2021-05-01 ENCOUNTER — Ambulatory Visit: Payer: PRIVATE HEALTH INSURANCE | Admitting: Physician Assistant

## 2021-05-01 ENCOUNTER — Encounter: Payer: Self-pay | Admitting: Physician Assistant

## 2021-05-01 ENCOUNTER — Other Ambulatory Visit: Payer: Self-pay

## 2021-05-01 VITALS — BP 161/68 | HR 86 | Ht 60.0 in | Wt 196.0 lb

## 2021-05-01 DIAGNOSIS — R21 Rash and other nonspecific skin eruption: Secondary | ICD-10-CM | POA: Diagnosis not present

## 2021-05-01 NOTE — Progress Notes (Signed)
Pt returns to clinic for biopsy of unknown rash. Does not itch or hurt. She did stop topamax and use clobetasol. Rash does seem to be fading. She wants to know what it is.   Punch Biopsy Procedure Note  Pre-operative Diagnosis: maculopapular rash  Post-operative Diagnosis: maculopapular rash  Locations:left lower lateral leg  Indications: unknown rash  Anesthesia: Lidocaine 1% without epinephrine without added sodium bicarbonate  Procedure Details  History of allergy to iodine: no Patient informed of the risks (including bleeding and infection) and benefits of the  procedure and Verbal informed consent obtained.  The lesion and surrounding area was given a sterile prep using chlorhexidine and draped in the usual sterile fashion. The skin was then stretched perpendicular to the skin tension lines and the lesion removed using the 83mm punch. The resulting ellipse was then closed. The wound was closed with 4-0 Prolene using simple interrupted stitches. Antibiotic ointment and a sterile dressing applied. The specimen was sent for pathologic examination. The patient tolerated the procedure well.  EBL: scant  Condition: Stable  Complications: none.  Plan: 1. Instructed to keep the wound dry and covered for 24-48h and clean thereafter. 2. Warning signs of infection were reviewed.   3. Recommended that the patient use OTC acetaminophen as needed for pain.  4. Return for suture removal in 1 weeks.  BP elevated today. Likely due to nervous about procedure. Asymptomatic.

## 2021-05-01 NOTE — Patient Instructions (Signed)
  Skin Biopsy, Care After This sheet gives you information about how to care for yourself after your procedure. Your health care provider may also give you more specific instructions. If you have problems or questions, contact your health care provider. What can I expect after the procedure? After the procedure, it is common to have:  Soreness.  Bruising.  Itching. Follow these instructions at home: Biopsy site care Follow instructions from your health care provider about how to take care of your biopsy site. Make sure you:  Wash your hands with soap and water before and after you change your bandage (dressing). If soap and water are not available, use hand sanitizer.  Apply ointment on your biopsy site as directed by your health care provider.  Change your dressing as told by your health care provider.  Leave stitches (sutures), skin glue, or adhesive strips in place. These skin closures may need to stay in place for 2 weeks or longer. If adhesive strip edges start to loosen and curl up, you may trim the loose edges. Do not remove adhesive strips completely unless your health care provider tells you to do that.  If the biopsy area bleeds, apply gentle pressure for 10 minutes. Check your biopsy site every day for signs of infection. Check for:  Redness, swelling, or pain.  Fluid or blood.  Warmth.  Pus or a bad smell.   General instructions  Rest and then return to your normal activities as told by your health care provider.  Take over-the-counter and prescription medicines only as told by your health care provider.  Keep all follow-up visits as told by your health care provider. This is important. Contact a health care provider if:  You have redness, swelling, or pain around your biopsy site.  You have fluid or blood coming from your biopsy site.  Your biopsy site feels warm to the touch.  You have pus or a bad smell coming from your biopsy site.  You have a  fever.  Your sutures, skin glue, or adhesive strips loosen or come off sooner than expected. Get help right away if:  You have bleeding that does not stop with pressure or a dressing. Summary  After the procedure, it is common to have soreness, bruising, and itching at the site.  Follow instructions from your health care provider about how to take care of your biopsy site.  Check your biopsy site every day for signs of infection.  Contact a health care provider if you have redness, swelling, or pain around your biopsy site, or your biopsy site feels warm to the touch.  Keep all follow-up visits as told by your health care provider. This is important. This information is not intended to replace advice given to you by your health care provider. Make sure you discuss any questions you have with your health care provider. Document Revised: 06/07/2018 Document Reviewed: 06/07/2018 Elsevier Patient Education  2021 Elsevier Inc.  

## 2021-05-08 ENCOUNTER — Ambulatory Visit (INDEPENDENT_AMBULATORY_CARE_PROVIDER_SITE_OTHER): Payer: PRIVATE HEALTH INSURANCE | Admitting: Physician Assistant

## 2021-05-08 ENCOUNTER — Other Ambulatory Visit: Payer: Self-pay

## 2021-05-08 VITALS — BP 152/73 | HR 97 | Ht 60.0 in | Wt 193.0 lb

## 2021-05-08 DIAGNOSIS — I7389 Other specified peripheral vascular diseases: Secondary | ICD-10-CM

## 2021-05-10 ENCOUNTER — Encounter: Payer: Self-pay | Admitting: Physician Assistant

## 2021-05-10 DIAGNOSIS — I7389 Other specified peripheral vascular diseases: Secondary | ICD-10-CM | POA: Insufficient documentation

## 2021-05-10 NOTE — Progress Notes (Signed)
Biopsy sit healing well. One suture taken out. Wound did seem to pucker out from stitch. Placed steri strips over top. bactroban given as needed. Maculopapular rash of bilateral lower extremities still present.   Discussed biopsy results of cutaneous vasculopathy. Needs follow up with dermatology to consider laser/light therapy. Pt has dermatologist. If needs any other referrals will reach back out to the clinic.

## 2021-08-09 ENCOUNTER — Other Ambulatory Visit: Payer: Self-pay | Admitting: Physician Assistant

## 2021-08-09 NOTE — Telephone Encounter (Signed)
Last weight note states she isn't a candidate for phentermine, has been seen since for non weight related appts. Last written in May for 3 months. Please advise.

## 2021-11-11 ENCOUNTER — Encounter: Payer: Self-pay | Admitting: Physician Assistant

## 2021-11-11 ENCOUNTER — Ambulatory Visit (INDEPENDENT_AMBULATORY_CARE_PROVIDER_SITE_OTHER): Payer: PRIVATE HEALTH INSURANCE

## 2021-11-11 ENCOUNTER — Other Ambulatory Visit: Payer: Self-pay

## 2021-11-11 ENCOUNTER — Ambulatory Visit (INDEPENDENT_AMBULATORY_CARE_PROVIDER_SITE_OTHER): Payer: PRIVATE HEALTH INSURANCE | Admitting: Physician Assistant

## 2021-11-11 VITALS — BP 150/69 | HR 101 | Ht 60.0 in | Wt 200.1 lb

## 2021-11-11 DIAGNOSIS — J4540 Moderate persistent asthma, uncomplicated: Secondary | ICD-10-CM

## 2021-11-11 DIAGNOSIS — M79675 Pain in left toe(s): Secondary | ICD-10-CM

## 2021-11-11 DIAGNOSIS — M79672 Pain in left foot: Secondary | ICD-10-CM

## 2021-11-11 MED ORDER — BUDESONIDE 180 MCG/ACT IN AEPB: 1.0000 | INHALATION_SPRAY | Freq: Two times a day (BID) | RESPIRATORY_TRACT | 5 refills | Status: DC

## 2021-11-11 NOTE — Progress Notes (Signed)
   Subjective:    Patient ID: April Norman, female    DOB: 29-Jul-1956, 65 y.o.   MRN: [id number]  HPI Pt is a 65 yo female with RA and asthma who presents to the clinic with left 2nd toe pain and left dorsal foot pain for last 2 weeks but seems to be worsening over the last week. No known injury. Not every had this before. Pain is in the 2nd toe and then radiates over dorsal foot and then around lateral ankle. Some minor swelling in the 2nd toe. Worse with walking for long periods of time.     .. Active Ambulatory Problems    Diagnosis Date Noted   Rheumatoid arthritis (HCC) 06/05/2014   Asthma 06/05/2014   Osteopenia 08/01/2014   Asthma, chronic 01/02/2015   Hyperplastic colon polyp 01/02/2015   Nodule of external ear 10/12/2015   Seborrheic dermatitis 10/12/2015   Bilateral foot pain 10/15/2015   Vitamin D deficiency 07/03/2017   Elevated LDL cholesterol level 07/13/2017   Acute telogen effluvium 01/15/2018   Eczema 04/19/2018   Class 2 severe obesity due to excess calories with serious comorbidity and body mass index (BMI) of 38.0 to 38.9 in adult (HCC) 08/17/2018   Eosinophilic esophagitis 09/14/2018   Cough 12/06/2019   Abnormal weight gain 08/10/2020   Elevated blood pressure reading 08/10/2020   Obesity (BMI 35.0-39.9 without comorbidity) 03/26/2021   Rash and nonspecific skin eruption 04/26/2021   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  Objective:   Physical Exam  NROM of left foot 2+pedal pulses No redness or warmth Swelling over 2nd toe left foot. Tenderness to palpation of soft tissue in btw great toe and 2nd toe.  No pain with dorsal or plantar flexion Some minimal tenderness around the peroneal  tendon with no redness or swelling.       Assessment & Plan:  Marland KitchenMarland KitchenDiagnoses and all orders for this visit:  Pain in left toe(s) -     DG Foot Complete Left; Future  Moderate persistent asthma without complication -     budesonide (PULMICORT) 180 MCG/ACT inhaler; Inhale 1-2 puffs into the lungs 2 (two) times daily.  Left foot pain -     DG Foot Complete Left; Future  Asthma well controlled. Pulmicort refilled.   RA managed by rheumatology. Per rheumatology do not think left foot pain related to RA.  Xray was negative.  2nd toe does appear to be a little swollen but no redness, warmth.  Tenderness to palpation along the soft tissue.  ? Extensor tendonitis of dorsal foot.  Use diclofenac cream/ice area/general rest. Follow up with Dr. Karie Schwalbe.

## 2021-11-12 ENCOUNTER — Encounter: Payer: Self-pay | Admitting: Physician Assistant

## 2021-11-12 NOTE — Progress Notes (Signed)
No acute abnormality. You do have a heel spur on the the bottom of your foot but not what is causing pain.  Use diclofenac cream. Avoid any excessive walking or exercise. I would like for you to see Dr. Karie Schwalbe our sports medicine provider and let him evaluate.

## 2021-11-18 ENCOUNTER — Other Ambulatory Visit: Payer: Self-pay

## 2021-11-18 ENCOUNTER — Ambulatory Visit (INDEPENDENT_AMBULATORY_CARE_PROVIDER_SITE_OTHER): Payer: PRIVATE HEALTH INSURANCE | Admitting: Sports Medicine

## 2021-11-18 DIAGNOSIS — M79672 Pain in left foot: Secondary | ICD-10-CM | POA: Diagnosis not present

## 2021-11-18 MED ORDER — HM CALCIUM-VITAMIN D-MINERALS 600-400 MG-UNIT PO TABS: ORAL_TABLET | ORAL | 11 refills | Status: AC

## 2021-11-18 NOTE — Progress Notes (Addendum)
    Procedures performed today:    None.  Independent interpretation of notes and tests performed by another provider:   X-rays were personally reviewed, there are mild midfoot degenerative changes, a potential cuboid navicular coalition.  Brief History, Exam, Impression, and Recommendations:    Left foot pain This is a very pleasant 65 year old female, 3 weeks ago she was getting more active in the gym, walking on the treadmill and has unfortunately developed increasing pain and swelling over the dorsal midfoot. She had x-rays done that were unrevealing, there was a bit of a calcaneal spur but she has no pain on the plantar heel. On exam she has moderate swelling, tenderness along the second through fourth metatarsal shafts distally all consistent with a metatarsal stress fracture. Adding a postop shoe with scaphoid pad, referral for custom molded orthotics, calcium and vitamin D supplementation, minimize weightbearing, she does understand this can take 6 to 8 weeks to heal.    ___________________________________________ Ihor Austin. Benjamin Stain, M.D., ABFM., CAQSM. Primary Care and Sports Medicine [facility]  Adjunct Instructor of Family Medicine  [employer]

## 2021-11-18 NOTE — Assessment & Plan Note (Signed)
This is a very pleasant 65 year old female, 3 weeks ago she was getting more active in the gym, walking on the treadmill and has unfortunately developed increasing pain and swelling over the dorsal midfoot. She had x-rays done that were unrevealing, there was a bit of a calcaneal spur but she has no pain on the plantar heel. On exam she has moderate swelling, tenderness along the second through fourth metatarsal shafts distally all consistent with a metatarsal stress fracture. Adding a postop shoe with scaphoid pad, referral for custom molded orthotics, calcium and vitamin D supplementation, minimize weightbearing, she does understand this can take 6 to 8 weeks to heal.

## 2021-11-25 ENCOUNTER — Ambulatory Visit: Payer: PRIVATE HEALTH INSURANCE | Admitting: Family Medicine

## 2021-11-25 ENCOUNTER — Encounter: Payer: Self-pay | Admitting: Family Medicine

## 2021-11-25 DIAGNOSIS — M79672 Pain in left foot: Secondary | ICD-10-CM | POA: Diagnosis not present

## 2021-11-25 NOTE — Assessment & Plan Note (Signed)
Pain is consistent with stress fracture with pain and swelling of the left foot.  - counseled on home exercise therapy and supportive care - orthotics

## 2021-11-25 NOTE — Progress Notes (Signed)
  April Norman - 65 y.o. female MRN [id number]  Date of birth: [birth year]  SUBJECTIVE:  Including CC & ROS.  No chief complaint on file.   April Norman is a 65 y.o. female that is  presenting with left stress fracture of her left foot.   Review of Systems See HPI   HISTORY: Past Medical, Surgical, Social, and Family History Reviewed & Updated per EMR.   Pertinent Historical Findings include:  Past Medical History:  Diagnosis Date   Asthma    Rheumatoid arthritis (HCC)    Vertigo, labyrinthine     Past Surgical History:  Procedure Laterality Date   ABDOMINAL HYSTERECTOMY     CHOLECYSTECTOMY      Family History  Problem Relation Age of Onset   Heart disease Father    Diabetes Father    Stroke Father    Diabetes Brother    Stomach cancer Paternal Grandmother     Social History   Socioeconomic History   Marital status: Married    Spouse name: Not on file   Number of children: Not on file   Years of education: Not on file   Highest education level: Not on file  Occupational History   Not on file  Tobacco Use   Smoking status: Former   Smokeless tobacco: Former  Substance and Sexual Activity   Alcohol use: No   Drug use: No   Sexual activity: Yes  Other Topics Concern   Not on file  Social History Narrative   Not on file   Social Determinants of Health   Financial Resource Strain: Not on file  Food Insecurity: Not on file  Transportation Needs: Not on file  Physical Activity: Not on file  Stress: Not on file  Social Connections: Not on file  Intimate Partner Violence: Not on file     PHYSICAL EXAM:  VS: Ht 5' (1.524 m)   Wt 200 lb (90.7 kg)   BMI 39.06 kg/m  Physical Exam Gen: NAD, alert, cooperative with exam, well-appearing   Patient was fitted for a standard, cushioned, semi-rigid orthotic. The orthotic was heated and afterward the patient stood on the orthotic blank positioned on the orthotic stand. The patient was positioned in  subtalar neutral position and 10 degrees of ankle dorsiflexion in a weight bearing stance. After completion of molding, a stable base was applied to the orthotic blank. The blank was ground to a stable position for weight bearing. Size: 9w Pairs: 2 Base: Blue EVA Additional Posting and Padding: None The patient ambulated these, and they were very comfortable.    ASSESSMENT & PLAN:   Left foot pain Pain is consistent with stress fracture with pain and swelling of the left foot.  - counseled on home exercise therapy and supportive care - orthotics

## 2021-12-30 ENCOUNTER — Other Ambulatory Visit: Payer: Self-pay

## 2021-12-30 ENCOUNTER — Ambulatory Visit: Payer: PRIVATE HEALTH INSURANCE | Admitting: Sports Medicine

## 2021-12-30 DIAGNOSIS — M79672 Pain in left foot: Secondary | ICD-10-CM | POA: Diagnosis not present

## 2021-12-30 NOTE — Assessment & Plan Note (Signed)
This is a pleasant 66 year old female, she had been increasing her exercise regimen and noted increasing pain second through fourth metatarsal shafts, swelling, tenderness over the shaft itself, we added a postop shoe, referral for custom orthotics as well. She returns today doing a lot better, not 100%, still has some discomfort and swelling over the metatarsal shafts, I do think she needs the postop shoe for another month or so, if insufficient improvement at the follow-up we will proceed with MRI, it sounds like she will need an MRI done at an outside location per her insurance requirements.

## 2021-12-30 NOTE — Progress Notes (Signed)
° ° °  Procedures performed today:    None.  Independent interpretation of notes and tests performed by another provider:   None.  Brief History, Exam, Impression, and Recommendations:    Left foot pain This is a pleasant 66 year old female, she had been increasing her exercise regimen and noted increasing pain second through fourth metatarsal shafts, swelling, tenderness over the shaft itself, we added a postop shoe, referral for custom orthotics as well. She returns today doing a lot better, not 100%, still has some discomfort and swelling over the metatarsal shafts, I do think she needs the postop shoe for another month or so, if insufficient improvement at the follow-up we will proceed with MRI, it sounds like she will need an MRI done at an outside location per her insurance requirements.    ___________________________________________ Ihor Austin. Benjamin Stain, M.D., ABFM., CAQSM. Primary Care and Sports Medicine [facility]  Adjunct Instructor of Family Medicine  [employer]

## 2022-01-27 ENCOUNTER — Ambulatory Visit: Payer: PRIVATE HEALTH INSURANCE | Admitting: Sports Medicine

## 2022-01-27 ENCOUNTER — Other Ambulatory Visit: Payer: Self-pay

## 2022-01-27 DIAGNOSIS — M79672 Pain in left foot: Secondary | ICD-10-CM

## 2022-01-27 NOTE — Assessment & Plan Note (Signed)
April Norman returns, she is a pleasant 66 year old female, she had increased her exercise regimen and then noticed increasing pain second through fourth metatarsal shaft with swelling and tenderness over the shaft itself, initially we added a postop shoe back in November. She improved, she also got some custom orthotics with Dr. Jordan Likes, improved as well. She returns today almost 100% better, still has a bit of discomfort at the MTPs, but no pain over the metatarsal shafts, eager to get back into a regular shoe. She is eager to also start back her exercise regimen, I have recommended starting at 50% of her typical distance/time with a 10% increase every week until back to normal. Return to see me on an as-needed basis.

## 2022-01-27 NOTE — Progress Notes (Signed)
° ° °  Procedures performed today:    None.  Independent interpretation of notes and tests performed by another provider:   None.  Brief History, Exam, Impression, and Recommendations:    Left foot pain April Norman returns, she is a pleasant 66 year old female, she had increased her exercise regimen and then noticed increasing pain second through fourth metatarsal shaft with swelling and tenderness over the shaft itself, initially we added a postop shoe back in November. She improved, she also got some custom orthotics with Dr. Jordan Likes, improved as well. She returns today almost 100% better, still has a bit of discomfort at the MTPs, but no pain over the metatarsal shafts, eager to get back into a regular shoe. She is eager to also start back her exercise regimen, I have recommended starting at 50% of her typical distance/time with a 10% increase every week until back to normal. Return to see me on an as-needed basis.    ___________________________________________ Ihor Austin. Benjamin Stain, M.D., ABFM., CAQSM. Primary Care and Sports Medicine [facility]  Adjunct Instructor of Family Medicine  [employer]

## 2022-02-14 ENCOUNTER — Other Ambulatory Visit: Payer: Self-pay | Admitting: Physician Assistant

## 2022-02-14 DIAGNOSIS — Z1231 Encounter for screening mammogram for malignant neoplasm of breast: Secondary | ICD-10-CM

## 2022-02-27 ENCOUNTER — Ambulatory Visit: Payer: PRIVATE HEALTH INSURANCE | Admitting: Family Medicine

## 2022-02-27 ENCOUNTER — Other Ambulatory Visit: Payer: Self-pay

## 2022-02-27 ENCOUNTER — Encounter: Payer: Self-pay | Admitting: Family Medicine

## 2022-02-27 VITALS — BP 146/73 | HR 98 | Resp 18 | Ht 60.0 in | Wt 203.0 lb

## 2022-02-27 DIAGNOSIS — R030 Elevated blood-pressure reading, without diagnosis of hypertension: Secondary | ICD-10-CM

## 2022-02-27 DIAGNOSIS — J4541 Moderate persistent asthma with (acute) exacerbation: Secondary | ICD-10-CM | POA: Diagnosis not present

## 2022-02-27 MED ORDER — DOXYCYCLINE HYCLATE 100 MG PO TABS: 100.0000 mg | ORAL_TABLET | Freq: Two times a day (BID) | ORAL | 0 refills | Status: DC

## 2022-02-27 MED ORDER — FLUTICASONE-SALMETEROL 250-50 MCG/ACT IN AEPB: 1.0000 | INHALATION_SPRAY | Freq: Two times a day (BID) | RESPIRATORY_TRACT | 1 refills | Status: DC

## 2022-02-27 NOTE — Progress Notes (Signed)
? ?Acute Office Visit ? ?Subjective:  ? ? Patient ID: April Norman, female    DOB: 07/16/1956, 66 y.o.   MRN: [id number] ? ?Chief Complaint  ?Patient presents with  ? Cough  ?  Dry cough, chest congestion 4-5 days. Patient states she has this once a year.   ? ? ?HPI ?Patient is in today for Dry cough, chest congestion 4-5 days. Patient states she has this once a year.  3 of rheumatoid arthritis. ? ?Past Medical History:  ?Diagnosis Date  ? Asthma   ? Rheumatoid arthritis ([place])   ? Vertigo, labyrinthine   ?   ? ?Past Surgical History:  ?Procedure Laterality Date  ? ABDOMINAL HYSTERECTOMY    ? CHOLECYSTECTOMY    ? ? ?Family History  ?Problem Relation Age of Onset  ? Heart disease Father   ? Diabetes Father   ? Stroke Father   ? Diabetes Brother   ? Stomach cancer Paternal Grandmother   ? ? ?Social History  ? ?Socioeconomic History  ? Marital status: Married  ?  Spouse name: Not on file  ? Number of children: Not on file  ? Years of education: Not on file  ? Highest education level: Not on file  ?Occupational History  ? Not on file  ?Tobacco Use  ? Smoking status: Former  ? Smokeless tobacco: Former  ?Substance and Sexual Activity  ? Alcohol use: No  ? Drug use: No  ? Sexual activity: Yes  ?Other Topics Concern  ? Not on file  ?Social History Narrative  ? Not on file  ? ?Social Determinants of Health  ? ?Financial Resource Strain: Not on file  ?Food Insecurity: Not on file  ?Transportation Needs: Not on file  ?Physical Activity: Not on file  ?Stress: Not on file  ?Social Connections: Not on file  ?Intimate Partner Violence: Not on file  ? ? ?Outpatient Medications Prior to Visit  ?Medication Sig Dispense Refill  ? Calcium Carbonate-Vit D-Min (HM CALCIUM-VITAMIN D-MINERALS) 600-400 MG-UNIT TABS 1 tablet p.o. twice daily 60 tablet 11  ? clobetasol cream (TEMOVATE) [id number] % Apply 1 application topically 2 (two) times daily. 60 g 0  ? hydroxychloroquine (PLAQUENIL) 200 MG tablet Take 1 tablet (200 mg total) by mouth 2  (two) times daily. 60 tablet 1  ? inFLIXimab-axxq (AVSOLA IV) Inject into the vein every 6 (six) weeks.    ? omeprazole (PRILOSEC) 40 MG capsule Take 40 mg by mouth daily.    ? budesonide (PULMICORT) 180 MCG/ACT inhaler Inhale 1-2 puffs into the lungs 2 (two) times daily. 3 each 5  ? phentermine 15 MG capsule     ? ?No facility-administered medications prior to visit.  ? ? ?Allergies  ?Allergen Reactions  ? Prednisone Swelling  ?  Swelling of face/neck ?"My allergist told me never to take this"  ? Singulair [Montelukast Sodium]   ?  diarrhea  ? Symbicort [Budesonide-Formoterol Fumarate]   ?  Burning in throat/mouth  ? ? ?Review of Systems ? ?   ?Objective:  ?  ?Physical Exam ?Constitutional:   ?   Appearance: She is well-developed.  ?HENT:  ?   Head: Normocephalic and atraumatic.  ?   Right Ear: External ear normal.  ?   Left Ear: External ear normal.  ?   Nose: Nose normal.  ?Eyes:  ?   Conjunctiva/sclera: Conjunctivae normal.  ?   Pupils: Pupils are equal, round, and reactive to light.  ?Neck:  ?   Thyroid: No thyromegaly.  ?  Cardiovascular:  ?   Rate and Rhythm: Normal rate and regular rhythm.  ?   Heart sounds: Normal heart sounds.  ?Pulmonary:  ?   Effort: Pulmonary effort is normal.  ?   Breath sounds: Wheezing present.  ?   Comments: Expiratory wheeze in the left lower lobe. ?Musculoskeletal:  ?   Cervical back: Neck supple.  ?Lymphadenopathy:  ?   Cervical: No cervical adenopathy.  ?Skin: ?   General: Skin is warm and dry.  ?Neurological:  ?   Mental Status: She is alert and oriented to person, place, and time.  ? ? ?BP (!) 146/73 (BP Location: Right Arm)   Pulse 98   Resp 18   Ht 5' (1.524 m)   Wt 203 lb (92.1 kg)   SpO2 99%   BMI 39.65 kg/m?  ?Wt Readings from Last 3 Encounters:  ?02/27/22 203 lb (92.1 kg)  ?11/25/21 200 lb (90.7 kg)  ?11/11/21 200 lb 1.6 oz (90.8 kg)  ? ? ?Health Maintenance Due  ?Topic Date Due  ? Zoster Vaccines- Shingrix (1 of 2) Never done  ? COVID-19 Vaccine (4 - Booster for  Pfizer series) 02/02/2021  ? INFLUENZA VACCINE  07/22/2021  ? TETANUS/TDAP  12/22/2021  ? MAMMOGRAM  01/30/2022  ? ? ?There are no preventive care reminders to display for this patient. ? ? ?Lab Results  ?Component Value Date  ? TSH 2.47 08/13/2020  ? ?Lab Results  ?Component Value Date  ? WBC 6.9 01/15/2018  ? HGB 13.4 01/15/2018  ? HCT 38.9 01/15/2018  ? MCV 85.1 01/15/2018  ? PLT 370 01/15/2018  ? ?Lab Results  ?Component Value Date  ? NA 139 08/13/2020  ? K 4.7 08/13/2020  ? CO2 28 08/13/2020  ? GLUCOSE 89 08/13/2020  ? BUN 22 08/13/2020  ? CREATININE 0.83 08/13/2020  ? BILITOT 0.5 08/13/2020  ? ALKPHOS 76 07/03/2017  ? AST 20 08/13/2020  ? ALT 18 08/13/2020  ? PROT 6.8 08/13/2020  ? ALBUMIN 4.4 07/03/2017  ? CALCIUM 9.4 08/13/2020  ? ?Lab Results  ?Component Value Date  ? CHOL 261 (H) 08/13/2020  ? ?Lab Results  ?Component Value Date  ? HDL 96 08/13/2020  ? ?Lab Results  ?Component Value Date  ? LDLCALC 153 (H) 08/13/2020  ? ?Lab Results  ?Component Value Date  ? TRIG 38 08/13/2020  ? ?Lab Results  ?Component Value Date  ? CHOLHDL 2.7 08/13/2020  ? ?Lab Results  ?Component Value Date  ? HGBA1C 4.9 08/13/2020  ? ? ?   ?Assessment & Plan:  ? ?Problem List Items Addressed This Visit   ? ?  ? Other  ? Elevated BP without diagnosis of hypertension  ?  Elevated BP on repeat.  Discussed keep an eye on this at home and at work.  Discussed goal of systolic less than [id number].  It was high the last time she was seen here. ?  ?  ? ?Other Visit Diagnoses   ? ? Moderate persistent asthma with exacerbation    -  Primary  ? Relevant Medications  ? fluticasone-salmeterol (ADVAIR) 250-50 MCG/ACT AEPB  ? ?  ? ?Asthma exacerbation-she has been using her Pulmicort consistently through the winter months.  So we discussed ramping up to a combination inhaler.  Looks like Advair is best covered on her plan so we will send that over to the pharmacy.  She says normally by May she feels better and at that point if she is doing well she can go  back down to just her plain Pulmicort.  I want her to start using her albuterol at least twice a day for the next for 5 days to really get her lungs open.  She does not tolerate prednisone so we will go ahead and treat with doxycycline.  If she is not feeling better after 1 week then please give [place] call back. ? ?Meds ordered this encounter  ?Medications  ? doxycycline (VIBRA-TABS) 100 MG tablet  ?  Sig: Take 1 tablet (100 mg total) by mouth 2 (two) times daily.  ?  Dispense:  14 tablet  ?  Refill:  0  ? fluticasone-salmeterol (ADVAIR) 250-50 MCG/ACT AEPB  ?  Sig: Inhale 1 puff into the lungs in the morning and at bedtime.  ?  Dispense:  60 each  ?  Refill:  1  ? ? ? ?Beatrice Lecher, MD ? ?

## 2022-02-27 NOTE — Assessment & Plan Note (Signed)
Elevated BP on repeat.  Discussed keep an eye on this at home and at work.  Discussed goal of systolic less than [id number].  It was high the last time she was seen here. ?

## 2022-03-06 ENCOUNTER — Ambulatory Visit (INDEPENDENT_AMBULATORY_CARE_PROVIDER_SITE_OTHER): Payer: PRIVATE HEALTH INSURANCE

## 2022-03-06 ENCOUNTER — Other Ambulatory Visit: Payer: Self-pay

## 2022-03-06 DIAGNOSIS — Z1231 Encounter for screening mammogram for malignant neoplasm of breast: Secondary | ICD-10-CM

## 2022-03-10 ENCOUNTER — Other Ambulatory Visit: Payer: Self-pay | Admitting: Physician Assistant

## 2022-03-10 DIAGNOSIS — R928 Other abnormal and inconclusive findings on diagnostic imaging of breast: Secondary | ICD-10-CM

## 2022-03-10 NOTE — Progress Notes (Signed)
Some asymmetry in the right breast on screening mammogram. You should be contacted to have some more images of the breast to better evaluate.

## 2022-03-13 ENCOUNTER — Ambulatory Visit: Payer: PRIVATE HEALTH INSURANCE

## 2022-03-27 ENCOUNTER — Ambulatory Visit
Admission: RE | Admit: 2022-03-27 | Discharge: 2022-03-27 | Disposition: A | Discharge status: Home / Self Care | Payer: PRIVATE HEALTH INSURANCE | Source: Ambulatory Visit | Attending: Physician Assistant | Admitting: Physician Assistant

## 2022-03-27 ENCOUNTER — Other Ambulatory Visit: Payer: Self-pay | Admitting: Physician Assistant

## 2022-03-27 DIAGNOSIS — N6489 Other specified disorders of breast: Secondary | ICD-10-CM

## 2022-03-27 DIAGNOSIS — R599 Enlarged lymph nodes, unspecified: Secondary | ICD-10-CM

## 2022-03-27 DIAGNOSIS — R928 Other abnormal and inconclusive findings on diagnostic imaging of breast: Secondary | ICD-10-CM

## 2022-03-27 NOTE — Progress Notes (Signed)
Diagnostic mammogram did show more suspicious characteristics so you will need a breast biopsy.

## 2022-04-03 ENCOUNTER — Ambulatory Visit
Admission: RE | Admit: 2022-04-03 | Discharge: 2022-04-03 | Disposition: A | Discharge status: Home / Self Care | Payer: PRIVATE HEALTH INSURANCE | Source: Ambulatory Visit | Attending: Physician Assistant | Admitting: Physician Assistant

## 2022-04-03 DIAGNOSIS — N6489 Other specified disorders of breast: Secondary | ICD-10-CM

## 2022-04-03 DIAGNOSIS — R599 Enlarged lymph nodes, unspecified: Secondary | ICD-10-CM

## 2022-04-03 HISTORY — PX: BREAST BIOPSY: SHX20

## 2022-04-04 NOTE — Progress Notes (Signed)
GREAT news. Negative for any malignancy. Mass represents fibrocystic changes!!

## 2022-04-11 ENCOUNTER — Other Ambulatory Visit: Payer: PRIVATE HEALTH INSURANCE

## 2022-08-06 NOTE — Progress Notes (Unsigned)
Annual Wellness Visit     Patient: April Norman, Female    DOB: [birth year], 66 y.o.   MRN: [id number]  Subjective  No chief complaint on file.   April Norman is a 66 y.o. female who presents today for her Annual Wellness Visit. She reports consuming a {diet types:17450} diet. {Exercise:19826} She generally feels {well/fairly well/poorly:18703}. She reports sleeping {well/fairly well/poorly:18703}. She {does/does not:200015} have additional problems to discuss today.   HPI  Vision:{Last Opthalmologist Visit:27382} and Dental: {CHL AMB ROS DENTAL (Optional):27383::"No current dental problems"}  Pap smear: Mammogram done in March 2023 that required further imaging  Colonoscopy due: 2025   Medications: Outpatient Medications Prior to Visit  Medication Sig   Calcium Carbonate-Vit D-Min (HM CALCIUM-VITAMIN D-MINERALS) 600-400 MG-UNIT TABS 1 tablet p.o. twice daily   clobetasol cream (TEMOVATE) 0.05 % Apply 1 application topically 2 (two) times daily.   doxycycline (VIBRA-TABS) 100 MG tablet Take 1 tablet (100 mg total) by mouth 2 (two) times daily.   fluticasone-salmeterol (ADVAIR) 250-50 MCG/ACT AEPB Inhale 1 puff into the lungs in the morning and at bedtime.   hydroxychloroquine (PLAQUENIL) 200 MG tablet Take 1 tablet (200 mg total) by mouth 2 (two) times daily.   inFLIXimab-axxq (AVSOLA IV) Inject into the vein every 6 (six) weeks.   omeprazole (PRILOSEC) 40 MG capsule Take 40 mg by mouth daily.   No facility-administered medications prior to visit.    Allergies  Allergen Reactions   Prednisone Swelling    Swelling of face/neck "My allergist told me never to take this"   Singulair [Montelukast Sodium]     diarrhea   Symbicort [Budesonide-Formoterol Fumarate]     Burning in throat/mouth    Patient Care Team: Nolene Ebbs as PCP - General (Family Medicine)  ROS      Objective  There were no vitals taken for this visit. {Vitals History  (Optional):23777}  Physical Exam    Most recent functional status assessment:     No data to display         Most recent fall risk assessment:    02/27/2022    4:15 PM  Fall Risk   Falls in the past year? 0  Number falls in past yr: 0  Injury with Fall? 0  Risk for fall due to : No Fall Risks  Follow up Falls prevention discussed;Falls evaluation completed    Most recent depression screenings:    02/27/2022    4:15 PM 08/10/2020   11:08 AM  PHQ 2/9 Scores  PHQ - 2 Score 0 0   Most recent cognitive screening:     No data to display         Most recent Audit-C alcohol use screening     No data to display         A score of 3 or more in women, and 4 or more in men indicates increased risk for alcohol abuse, EXCEPT if all of the points are from question 1   Vision/Hearing Screen: No results found.  {Labs (Optional):23779}  No results found for any visits on 08/07/22.    Assessment & Plan   Annual wellness visit done today including the all of the following: Reviewed patient's Family Medical History Reviewed and updated list of patient's medical providers Assessment of cognitive impairment was done Assessed patient's functional ability Established a written schedule for health screening services Health Risk Assessent Completed and Reviewed  Exercise Activities and Dietary recommendations  Goals  None     Immunization History  Administered Date(s) Administered   Influenza,inj,Quad PF,6+ Mos 10/31/2014, 10/12/2015, 12/01/2016, 08/17/2018, 08/10/2020   PFIZER(Purple Top)SARS-COV-2 Vaccination 04/11/2020, 05/12/2020, 12/08/2020   PPD Test 12/01/2016    Health Maintenance  Topic Date Due   Zoster Vaccines- Shingrix (1 of 2) Never done   COVID-19 Vaccine (4 - Pfizer risk series) 02/02/2021   TETANUS/TDAP  12/22/2021   INFLUENZA VACCINE  07/22/2022   MAMMOGRAM  03/07/2023   COLONOSCOPY (Pts 45-15yrs Insurance coverage will need to be confirmed)   11/23/2024   Hepatitis C Screening  Completed   HIV Screening  Completed   HPV VACCINES  Aged Out     Discussed health benefits of physical activity, and encouraged her to engage in regular exercise appropriate for her age and condition.    Problem List Items Addressed This Visit   None   No follow-ups on file.     Charlton Amor, DO

## 2022-08-07 ENCOUNTER — Ambulatory Visit (INDEPENDENT_AMBULATORY_CARE_PROVIDER_SITE_OTHER): Payer: PRIVATE HEALTH INSURANCE | Admitting: Family Medicine

## 2022-08-07 ENCOUNTER — Encounter: Payer: Self-pay | Admitting: Family Medicine

## 2022-08-07 VITALS — BP 152/74 | HR 85 | Ht 60.0 in | Wt 218.0 lb

## 2022-08-07 DIAGNOSIS — Z13220 Encounter for screening for lipoid disorders: Secondary | ICD-10-CM

## 2022-08-07 DIAGNOSIS — R030 Elevated blood-pressure reading, without diagnosis of hypertension: Secondary | ICD-10-CM

## 2022-08-07 DIAGNOSIS — Z0000 Encounter for general adult medical examination without abnormal findings: Secondary | ICD-10-CM

## 2022-08-07 DIAGNOSIS — J209 Acute bronchitis, unspecified: Secondary | ICD-10-CM | POA: Diagnosis not present

## 2022-08-07 MED ORDER — DOXYCYCLINE HYCLATE 100 MG PO TABS: 100.0000 mg | ORAL_TABLET | Freq: Two times a day (BID) | ORAL | 0 refills | Status: DC

## 2022-08-07 NOTE — Assessment & Plan Note (Signed)
-   pt is up to date on health screenings. She is due for lipid panel, cbc, and cmp today  - had mammogram in March  - up to date on dentist and eye doctor  - counseled on diet and exercise. Discussed with her arthritis to try and do water aerobics or stationary bike.

## 2022-08-07 NOTE — Assessment & Plan Note (Addendum)
-   pt bp elevated on arrival  - repeat BP 152/74 - advised pt to follow up with jade in one month to discuss BP

## 2022-08-07 NOTE — Assessment & Plan Note (Signed)
-   heard expiratory wheezing on exam in all lung fields associated with increase in coughing and hx of asthma. Have given a prescription for doxcycline for treatment - if no better need to follow up and get a cxr to rule out pna.

## 2022-08-08 LAB — COMPLETE METABOLIC PANEL WITH GFR
AG Ratio: 1.4 (calc) (ref 1.0–2.5)
ALT: 21 U/L (ref 6–29)
AST: 24 U/L (ref 10–35)
Albumin: 3.9 g/dL (ref 3.6–5.1)
Alkaline phosphatase (APISO): 66 U/L (ref 37–153)
BUN: 21 mg/dL (ref 7–25)
CO2: 32 mmol/L (ref 20–32)
Calcium: 9.7 mg/dL (ref 8.6–10.4)
Chloride: 104 mmol/L (ref 98–110)
Creat: 0.81 mg/dL (ref 0.50–1.05)
Globulin: 2.7 g/dL (calc) (ref 1.9–3.7)
Glucose, Bld: 96 mg/dL (ref 65–139)
Potassium: 4.5 mmol/L (ref 3.5–5.3)
Sodium: 142 mmol/L (ref 135–146)
Total Bilirubin: 0.3 mg/dL (ref 0.2–1.2)
Total Protein: 6.6 g/dL (ref 6.1–8.1)
eGFR: 82 mL/min/{1.73_m2} (ref >=60)

## 2022-08-08 LAB — CBC
HCT: 38.6 % (ref 35.0–45.0)
Hemoglobin: 12.7 g/dL (ref 11.7–15.5)
MCH: 29.8 pg (ref 27.0–33.0)
MCHC: 32.9 g/dL (ref 32.0–36.0)
MCV: 90.6 fL (ref 80.0–100.0)
MPV: 9.9 fL (ref 7.5–12.5)
Platelets: 357 10*3/uL (ref 140–400)
RBC: 4.26 10*6/uL (ref 3.80–5.10)
RDW: 12.3 % (ref 11.0–15.0)
WBC: 10 10*3/uL (ref 3.8–10.8)

## 2022-08-08 LAB — LIPID PANEL
Cholesterol: 269 mg/dL — ABNORMAL HIGH (ref <200)
HDL: 91 mg/dL (ref >=50)
LDL Cholesterol (Calc): 162 mg/dL (calc) — ABNORMAL HIGH
Non-HDL Cholesterol (Calc): 178 mg/dL (calc) — ABNORMAL HIGH (ref <130)
Total CHOL/HDL Ratio: 3 (calc) (ref <5.0)
Triglycerides: 68 mg/dL (ref <150)

## 2022-08-11 ENCOUNTER — Other Ambulatory Visit: Payer: Self-pay | Admitting: Family Medicine

## 2022-08-11 ENCOUNTER — Telehealth: Payer: Self-pay

## 2022-08-11 DIAGNOSIS — J209 Acute bronchitis, unspecified: Secondary | ICD-10-CM

## 2022-08-11 MED ORDER — AMOXICILLIN-POT CLAVULANATE 875-125 MG PO TABS: 1.0000 | ORAL_TABLET | Freq: Every day | ORAL | 0 refills | Status: AC

## 2022-08-11 NOTE — Telephone Encounter (Signed)
Pt verbalizes understanding of medication and recommendations.

## 2022-08-11 NOTE — Progress Notes (Signed)
Pt called and stated she was having diarrhea with doxy have sent in augmentin.

## 2022-08-12 ENCOUNTER — Telehealth: Payer: Self-pay

## 2022-08-12 NOTE — Telephone Encounter (Signed)
[facility] called stating that they received an RX for pt for augmentin yesterday for QD dosing.  However, they state that this medication is usually dosed BID.  Please confirm frequency of dosing.  Tiajuana Amass, CMA

## 2022-08-13 NOTE — Telephone Encounter (Signed)
Called and left message clarification for pharmacy

## 2022-10-27 ENCOUNTER — Other Ambulatory Visit: Payer: Self-pay | Admitting: Physician Assistant

## 2022-10-27 DIAGNOSIS — N6310 Unspecified lump in the right breast, unspecified quadrant: Secondary | ICD-10-CM

## 2022-11-17 ENCOUNTER — Other Ambulatory Visit: Payer: Self-pay | Admitting: Physician Assistant

## 2022-11-17 DIAGNOSIS — J4540 Moderate persistent asthma, uncomplicated: Secondary | ICD-10-CM

## 2022-12-08 ENCOUNTER — Ambulatory Visit
Admission: RE | Admit: 2022-12-08 | Discharge: 2022-12-08 | Disposition: A | Discharge status: Home / Self Care | Payer: PRIVATE HEALTH INSURANCE | Source: Ambulatory Visit | Attending: Physician Assistant | Admitting: Physician Assistant

## 2022-12-08 DIAGNOSIS — N6310 Unspecified lump in the right breast, unspecified quadrant: Secondary | ICD-10-CM

## 2023-03-06 LAB — HM COLONOSCOPY

## 2023-03-12 IMAGING — MG MM BREAST BX W/ LOC DEV 1ST LESION IMAGE BX SPEC STEREO GUIDE*R*
8 of 13 series · 8 of 21 positions shown · non-contrast
Comparison: Previous exams.
COMPARISON: Previous exams.

Addendum:
CLINICAL DATA: Patient with a subtle distortion in the upper-outer
quadrant of the RIGHT breast presents today for stereotactic biopsy.

EXAM:
RIGHT BREAST STEREOTACTIC CORE NEEDLE BIOPSY

[R (1 of 8)]
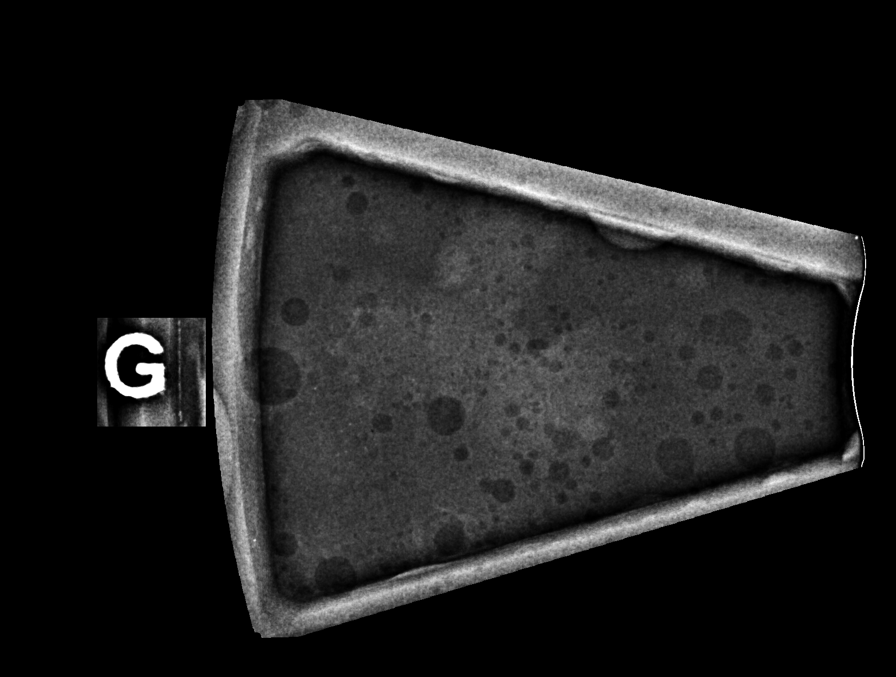

[R (2 of 8)]
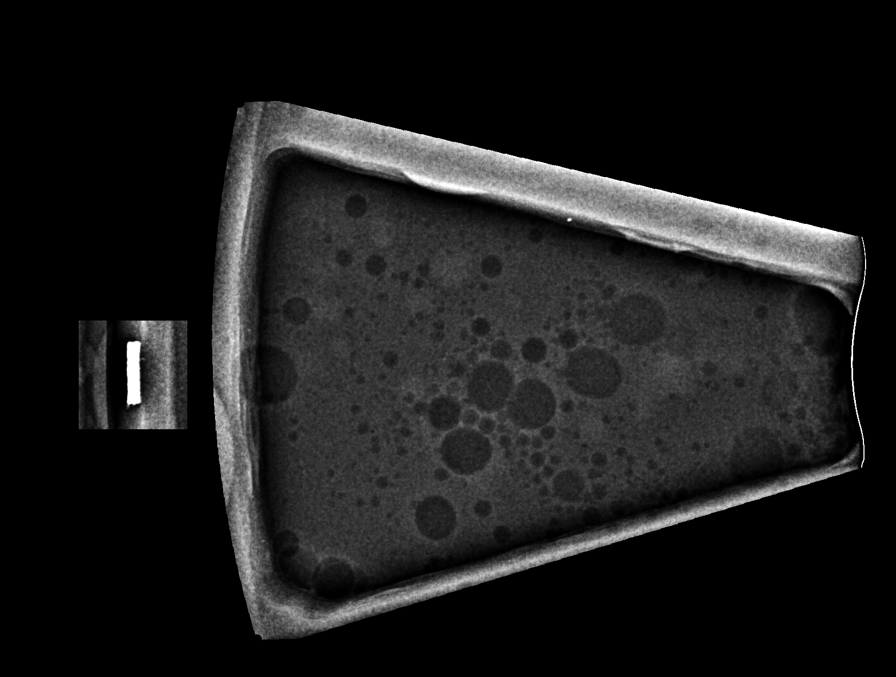

[R (3 of 8)]
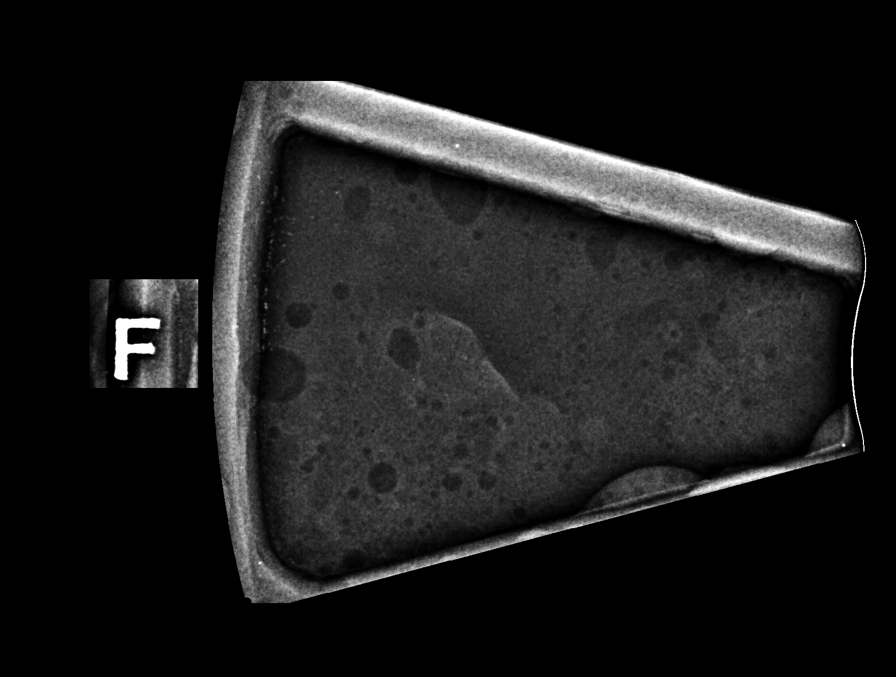

[R (4 of 8)]
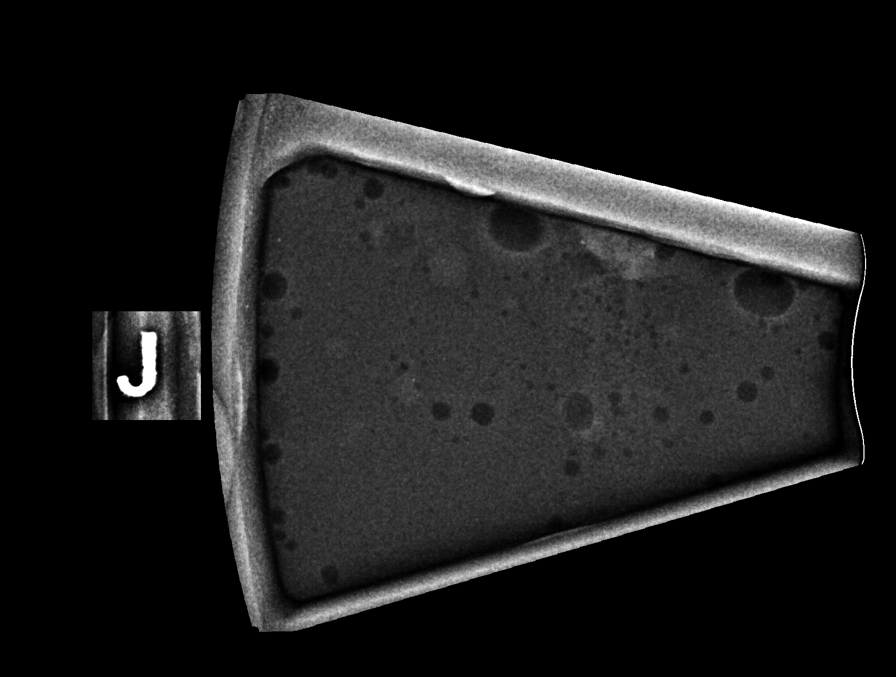

[R (5 of 8)]
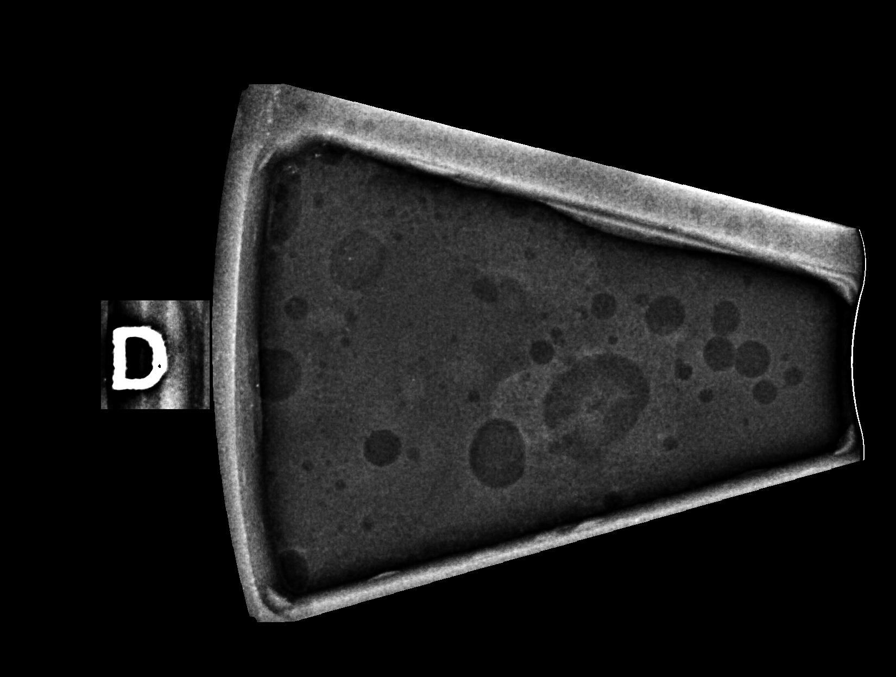

[R (6 of 8)]
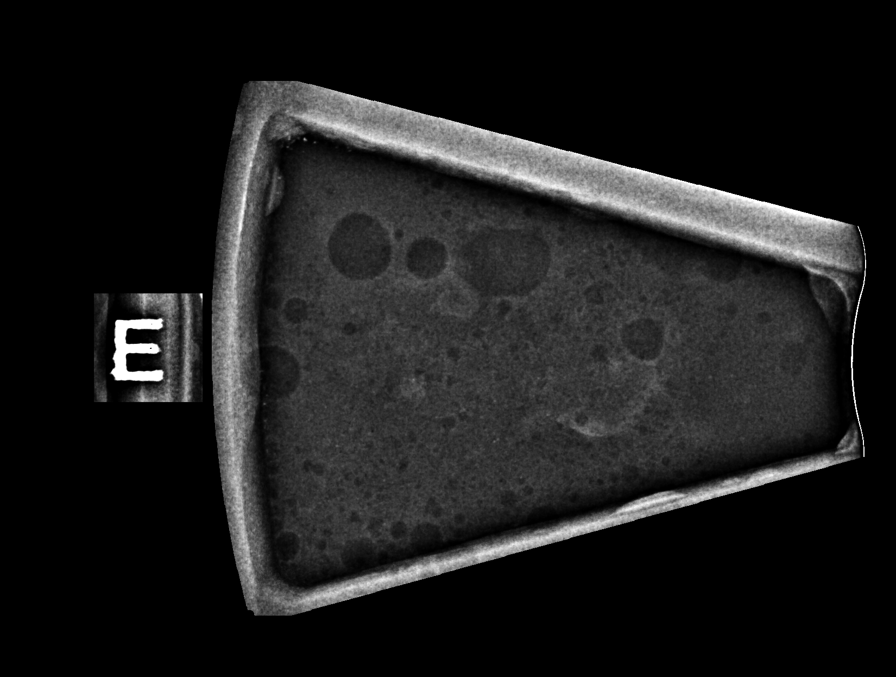

[R (7 of 8)]
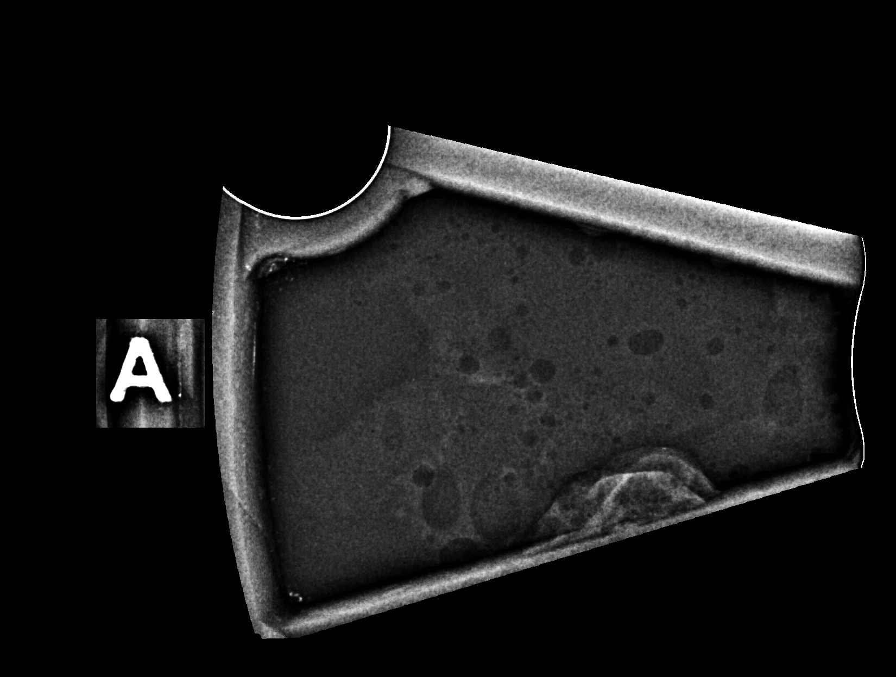

[R (8 of 8)]
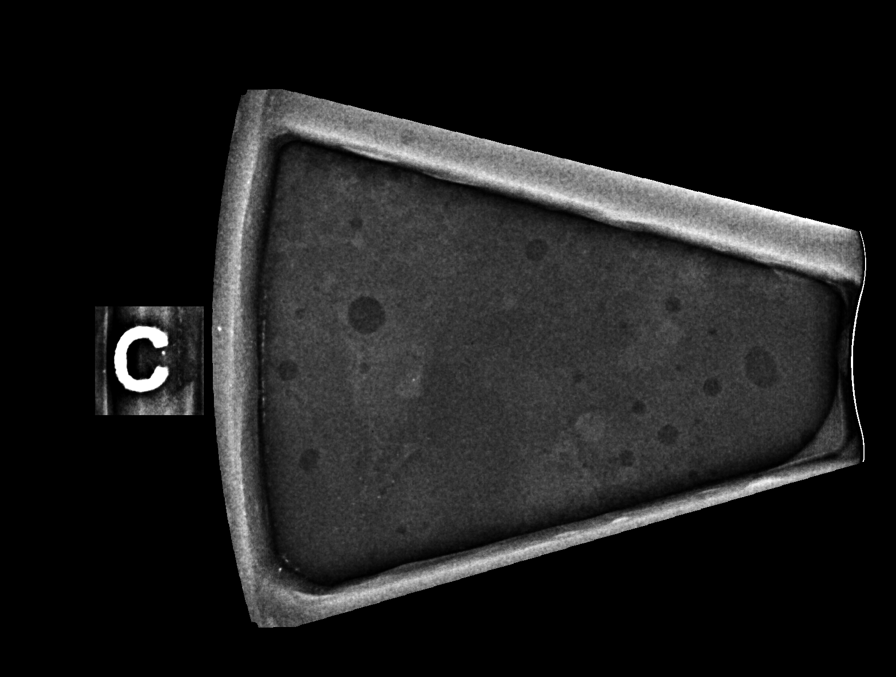

[8 of 21 positions shown; findings below may reference images not displayed]



On preprocedure images, the distortion is less conspicuous but the
asymmetry persists for biopsy targeting.

Using sterile technique and 1% Lidocaine as local anesthetic, under
stereotactic guidance, a 9 gauge vacuum assisted device was used to
perform core needle biopsy of distortion/asymmetry in the upper
outer quadrant of the RIGHT breast using a superior approach.

Lesion quadrant: Upper outer quadrant

At the conclusion of the procedure, X shaped tissue marker clip was
deployed into the biopsy cavity. Follow-up 2-view mammogram was
performed and dictated separately.
IMPRESSION: Stereotactic-guided biopsy of the subtle distortion/asymmetry within
the upper-outer quadrant the RIGHT breast. No apparent
complications.

ADDENDUM:
Pathology revealed FIBROCYSTIC CHANGES AND FIBROSCLEROSIS WITHOUT
EPITHELIAL HYPERPLASIA AND WITH FOCAL INTRALUMINAL
MICROCALCIFICATION of the RIGHT breast, upper outer quadrant, (x
clip). This was found to be concordant by Dr. Rusbel Mcwhorter.

Pathology revealed PORTION OF A BENIGN HYPERPLASTIC LYMPH NODE of
the RIGHT axilla, (hydromark clip). This was found to be concordant
by Dr. Rusbel Mcwhorter and compatible with patient's history of
rheumatoid arthritis.

Pathology results were discussed with the patient by telephone. The
patient reported doing well after the biopsies with tenderness at
the sites. Post biopsy instructions and care were reviewed and
questions were answered. The patient was encouraged to call The
direct phone number was provided.

The patient was instructed to return for RIGHT diagnostic
mammography in 6 months to ensure stability of the surrounding
breast tissues of the upper outer RIGHT breast.

Pathology results reported by Kester Ashmore, RN on 04/04/2022.



On preprocedure images, the distortion is less conspicuous but the
asymmetry persists for biopsy targeting.

Using sterile technique and 1% Lidocaine as local anesthetic, under
stereotactic guidance, a 9 gauge vacuum assisted device was used to
perform core needle biopsy of distortion/asymmetry in the upper
outer quadrant of the RIGHT breast using a superior approach.

Lesion quadrant: Upper outer quadrant

At the conclusion of the procedure, X shaped tissue marker clip was
deployed into the biopsy cavity. Follow-up 2-view mammogram was
performed and dictated separately.
IMPRESSION: Stereotactic-guided biopsy of the subtle distortion/asymmetry within
the upper-outer quadrant the RIGHT breast. No apparent
complications.

## 2023-03-27 ENCOUNTER — Other Ambulatory Visit: Payer: Self-pay | Admitting: Physician Assistant

## 2023-03-27 DIAGNOSIS — N6310 Unspecified lump in the right breast, unspecified quadrant: Secondary | ICD-10-CM

## 2023-04-06 ENCOUNTER — Encounter: Payer: Self-pay | Admitting: *Deleted

## 2023-04-10 ENCOUNTER — Encounter: Payer: Self-pay | Admitting: Physician Assistant

## 2023-05-04 ENCOUNTER — Ambulatory Visit (INDEPENDENT_AMBULATORY_CARE_PROVIDER_SITE_OTHER): Payer: PRIVATE HEALTH INSURANCE | Admitting: Physician Assistant

## 2023-05-04 ENCOUNTER — Encounter: Payer: Self-pay | Admitting: Physician Assistant

## 2023-05-04 VITALS — BP 166/79 | HR 89 | Temp 98.0°F | Ht 60.0 in | Wt 207.0 lb

## 2023-05-04 DIAGNOSIS — J329 Chronic sinusitis, unspecified: Secondary | ICD-10-CM | POA: Diagnosis not present

## 2023-05-04 DIAGNOSIS — J4540 Moderate persistent asthma, uncomplicated: Secondary | ICD-10-CM

## 2023-05-04 DIAGNOSIS — J40 Bronchitis, not specified as acute or chronic: Secondary | ICD-10-CM | POA: Diagnosis not present

## 2023-05-04 MED ORDER — AZITHROMYCIN 250 MG PO TABS: ORAL_TABLET | ORAL | 0 refills | Status: DC

## 2023-05-04 MED ORDER — METHYLPREDNISOLONE SODIUM SUCC 125 MG IJ SOLR
125.0000 mg | Freq: Once | INTRAMUSCULAR | Status: AC
  Administered 2023-05-04: 125 mg via INTRAMUSCULAR

## 2023-05-04 MED ORDER — ALBUTEROL SULFATE HFA 108 (90 BASE) MCG/ACT IN AERS: 2.0000 | INHALATION_SPRAY | Freq: Four times a day (QID) | RESPIRATORY_TRACT | 0 refills | Status: DC | PRN

## 2023-05-04 MED ORDER — TRELEGY ELLIPTA 200-62.5-25 MCG/ACT IN AEPB: INHALATION_SPRAY | RESPIRATORY_TRACT | 5 refills | Status: DC

## 2023-05-04 NOTE — Progress Notes (Signed)
Acute Office Visit  Subjective:     Patient ID: April Norman, female    DOB: [birth year], 67 y.o.   MRN: [id number]  Chief Complaint  Patient presents with   Sinus Problem    HPI Patient is in today for sinus pressure, cough, congestion for 6 days. Her husband was recently sick. She has asthma and immunocompromised.  She is taking theraflu without any benefit. No fever, chills body aches. Her cough is productive and some SOB.   Pts husband was switched to trelegy and he loved it. She would like to switched from advair.   .. Active Ambulatory Problems    Diagnosis Date Noted   Rheumatoid arthritis (HCC) 06/05/2014   Asthma 06/05/2014   Osteopenia 08/01/2014   Asthma, chronic 01/02/2015   Hyperplastic colon polyp 01/02/2015   Nodule of external ear 10/12/2015   Seborrheic dermatitis 10/12/2015   Left foot pain 10/15/2015   Vitamin D deficiency 07/03/2017   Elevated LDL cholesterol level 07/13/2017   Acute telogen effluvium 01/15/2018   Eczema 04/19/2018   Class 2 severe obesity due to excess calories with serious comorbidity and body mass index (BMI) of 38.0 to 38.9 in adult (HCC) 08/17/2018   Eosinophilic esophagitis 09/14/2018   Cough 12/06/2019   Abnormal weight gain 08/10/2020   Elevated blood pressure reading 08/10/2020   Obesity (BMI 35.0-39.9 without comorbidity) 03/26/2021   Rash and nonspecific skin eruption 04/26/2021   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     Objective:    BP (!) 166/79   Pulse 89   Temp 98 F (36.7 C) (Oral)   Ht 5' (1.524 m)   Wt 207 lb (93.9 kg)   SpO2 99%   BMI 40.43 kg/m  BP Readings from Last 3 Encounters:   05/04/23 (!) 166/79  08/07/22 (!) 152/74  02/27/22 (!) 146/73   Wt Readings from Last 3 Encounters:  05/04/23 207 lb (93.9 kg)  08/07/22 218 lb (98.9 kg)  02/27/22 203 lb (92.1 kg)      Physical Exam Constitutional:      Appearance: Normal appearance. She is obese.  HENT:     Head: Normocephalic.     Right Ear: Tympanic membrane, ear canal and external ear normal. There is no impacted cerumen.     Left Ear: Tympanic membrane, ear canal and external ear normal. There is no impacted cerumen.     Nose: Congestion present.     Mouth/Throat:     Mouth: Mucous membranes are moist.     Pharynx: Posterior oropharyngeal erythema present. No oropharyngeal exudate.  Eyes:     Conjunctiva/sclera: Conjunctivae normal.  Cardiovascular:     Rate and Rhythm: Normal rate and regular rhythm.  Pulmonary:     Effort: Pulmonary effort is normal.     Breath sounds: Normal breath sounds. No wheezing or rhonchi.  Musculoskeletal:     Cervical back: Normal range of motion and neck supple. No tenderness.     Right lower leg: No edema.     Left lower leg: No edema.  Lymphadenopathy:     Cervical: No cervical adenopathy.  Neurological:     General: No focal deficit  present.     Mental Status: She is alert and oriented to person, place, and time.  Psychiatric:        Mood and Affect: Mood normal.          Assessment & Plan:  Marland KitchenMarland KitchenGuadlupe was seen today for sinus problem.  Diagnoses and all orders for this visit:  Sinobronchitis -     azithromycin (ZITHROMAX Z-PAK) 250 MG tablet; Take 2 tablets (500 mg) on  Day 1,  followed by 1 tablet (250 mg) once daily on Days 2 through 5. -     albuterol (VENTOLIN HFA) 108 (90 Base) MCG/ACT inhaler; Inhale 2 puffs into the lungs every 6 (six) hours as needed. -     methylPREDNISolone sodium succinate (SOLU-MEDROL) 125 mg/2 mL injection 125 mg  Moderate persistent asthma without complication -     Fluticasone-Umeclidin-Vilant (TRELEGY ELLIPTA) 200-62.5-25  MCG/ACT AEPB; Take one puff daily.   Zpak, solumedrol 125mg  IM, albuterol as needed Rest and hydration  Stop advair, start trelegy Follow up as needed or if symptoms persist or worsen Tandy Gaw, PA-C

## 2023-05-04 NOTE — Patient Instructions (Signed)

## 2023-05-15 ENCOUNTER — Ambulatory Visit
Admission: RE | Admit: 2023-05-15 | Discharge: 2023-05-15 | Disposition: A | Discharge status: Home / Self Care | Payer: PRIVATE HEALTH INSURANCE | Source: Ambulatory Visit | Attending: Physician Assistant | Admitting: Physician Assistant

## 2023-05-15 DIAGNOSIS — N6310 Unspecified lump in the right breast, unspecified quadrant: Secondary | ICD-10-CM

## 2023-05-19 NOTE — Progress Notes (Signed)
Normal mammogram. Follow up in 1 year.

## 2023-11-23 ENCOUNTER — Encounter: Payer: Self-pay | Admitting: Physician Assistant

## 2023-11-23 ENCOUNTER — Ambulatory Visit (INDEPENDENT_AMBULATORY_CARE_PROVIDER_SITE_OTHER): Payer: PRIVATE HEALTH INSURANCE | Admitting: Physician Assistant

## 2023-11-23 VITALS — BP 182/90

## 2023-11-23 DIAGNOSIS — Z1329 Encounter for screening for other suspected endocrine disorder: Secondary | ICD-10-CM

## 2023-11-23 DIAGNOSIS — Z6841 Body Mass Index (BMI) 40.0 and over, adult: Secondary | ICD-10-CM | POA: Diagnosis not present

## 2023-11-23 DIAGNOSIS — Z13220 Encounter for screening for lipoid disorders: Secondary | ICD-10-CM

## 2023-11-23 DIAGNOSIS — Z79899 Other long term (current) drug therapy: Secondary | ICD-10-CM

## 2023-11-23 DIAGNOSIS — Z23 Encounter for immunization: Secondary | ICD-10-CM | POA: Diagnosis not present

## 2023-11-23 DIAGNOSIS — I10 Essential (primary) hypertension: Secondary | ICD-10-CM | POA: Diagnosis not present

## 2023-11-23 DIAGNOSIS — Z780 Asymptomatic menopausal state: Secondary | ICD-10-CM

## 2023-11-23 DIAGNOSIS — R1013 Epigastric pain: Secondary | ICD-10-CM

## 2023-11-23 DIAGNOSIS — E66813 Obesity, class 3: Secondary | ICD-10-CM

## 2023-11-23 DIAGNOSIS — M0579 Rheumatoid arthritis with rheumatoid factor of multiple sites without organ or systems involvement: Secondary | ICD-10-CM

## 2023-11-23 DIAGNOSIS — L219 Seborrheic dermatitis, unspecified: Secondary | ICD-10-CM

## 2023-11-23 LAB — POCT GLYCOSYLATED HEMOGLOBIN (HGB A1C): Hemoglobin A1C: 5.2 % (ref 4.0–5.6)

## 2023-11-23 MED ORDER — LOSARTAN POTASSIUM-HCTZ 50-12.5 MG PO TABS: 1.0000 | ORAL_TABLET | Freq: Every day | ORAL | 0 refills | Status: DC

## 2023-11-23 MED ORDER — CLOTRIMAZOLE-BETAMETHASONE 1-0.05 % EX CREA: 1.0000 | TOPICAL_CREAM | Freq: Two times a day (BID) | CUTANEOUS | 0 refills | Status: AC

## 2023-11-23 NOTE — Progress Notes (Addendum)
Acute Office Visit  Subjective:     Patient ID: April Norman, female    DOB: 1956/03/27, 67 y.o.   MRN: [id number]  Chief Complaint  Patient presents with   Medical Management of Chronic Issues    Unspecified  rash on rt , charitistic are scaly  peeling  and ichthy     HPI Patient is in today for scaly rash behind her left ear. It is slightly itchy and it is painful when her skin cracks. She has to wear a mask at work which makes it worse. She has tried moisturizers which have not worked.   Patient also wants to discuss weight gain. She was prescribed Wegovy for weight loss but her insurance denied it. She reports increased stress with her husband fighting cancer. She is not following any diet and not exercising.   Patient's BP was elevated today. She says it has never been in the 180s. Her average is in the 150s. She does check at home and seems to be running high there too. Denies any CP, palpitations, headaches or vision changes.   She sees GI for epigastric pain and GERD. He ordered labs and would like to get them drawn today as well.   .. Active Ambulatory Problems    Diagnosis Date Noted   Rheumatoid arthritis (HCC) 06/05/2014   Asthma 06/05/2014   Osteopenia 08/01/2014   Asthma, chronic 01/02/2015   Hyperplastic colon polyp 01/02/2015   Nodule of external ear 10/12/2015   Seborrheic dermatitis 10/12/2015   Left foot pain 10/15/2015   Vitamin D deficiency 07/03/2017   Elevated LDL cholesterol level 07/13/2017   Acute telogen effluvium 01/15/2018   Eczema 04/19/2018   Class 3 severe obesity due to excess calories without serious comorbidity with body mass index (BMI) of 40.0 to 44.9 in adult (HCC) 08/17/2018   Eosinophilic esophagitis 09/14/2018   Cough 12/06/2019   Abnormal weight gain 08/10/2020   Elevated blood pressure reading 08/10/2020   Obesity (BMI 35.0-39.9 without comorbidity) 03/26/2021   Rash and nonspecific skin eruption 04/26/2021   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      Objective:    BP (!) 182/90 (BP Location: Left Arm)  BP Readings from Last 3 Encounters:  11/23/23 (!) 182/90  05/04/23 (!) 166/79  08/07/22 (!) 152/74   Wt Readings from Last 3 Encounters:  05/04/23 207 lb (93.9 kg)  08/07/22 218 lb (98.9 kg)  02/27/22 203 lb (92.1 kg)   .Marland Kitchen Lab Results  Component Value Date   HGBA1C 5.2 11/23/2023     Physical Exam Constitutional:      Appearance: Normal appearance. She is obese.  Cardiovascular:     Rate and Rhythm: Normal rate.  Pulmonary:     Effort: Pulmonary effort is normal.  Musculoskeletal:     Right lower leg: No edema.     Left lower leg: No edema.  Skin:    Findings: Rash present.     Comments: Scaly, erythematous rash behind and on top of left ear. Cracked, raw skin along crease of left posterior  auricle. Right ear normal.  Neurological:     General: No focal deficit present.     Mental Status: She is alert and oriented to person, place, and time.  Psychiatric:        Mood and Affect: Mood normal.        Assessment & Plan:  Marland KitchenMarland KitchenTashala was seen today for medical management of chronic issues.  Diagnoses and all orders for this visit:  Essential hypertension -     losartan-hydrochlorothiazide (HYZAAR) 50-12.5 MG tablet; Take 1 tablet by mouth daily.  Rheumatoid arthritis involving multiple sites with positive rheumatoid factor (HCC)  Need for influenza vaccination -     Flu Vaccine Trivalent High Dose (Fluad)  Class 3 severe obesity due to excess calories without  serious comorbidity with body mass index (BMI) of 40.0 to 44.9 in adult (HCC) -     POCT HgB A1C  Epigastric pain -     CMP14+EGFR -     CBC w/Diff/Platelet -     Lipase  Screening for lipid disorders -     Lipid panel  Medication management -     VITAMIN D 25 Hydroxy (Vit-D Deficiency, Fractures)  Post-menopausal -     VITAMIN D 25 Hydroxy (Vit-D Deficiency, Fractures)  Thyroid disorder screening -     TSH + free T4  Seborrheic dermatitis -     clotrimazole-betamethasone (LOTRISONE) cream; Apply 1 Application topically 2 (two) times daily. As needed behind ears.  Immunization due -     Pneumococcal conjugate vaccine 20-valent (Prevnar 20)    Lotrisone cream prescribed to cover fungal and inflammatory etiologies of seb derm.   Discussed cheaper weight loss medication options including Contrave, compounded injections, and Slenderiix drops. A1c to goal at 5.2. Advised patient to limit processed foods, carbs, and sugars and to increase exercise to 150 min/wk.  BP not to goal of < 130/80 and looking back at history has been this way. Need to treat Started losartan-hydrochlorothiazide. Discussed with patient that weight loss, diet improvement, and exercise can improve BP. Nurse BP check in 2 wks.  Ordered labs for GI after chart review.   Administered flu and pneumonia vaccines today. Patient declined COVID and Tdap today.  Return in about 2 weeks (around 12/07/2023) for nurse BP check.  Tandy Gaw, PA-C

## 2023-11-24 ENCOUNTER — Encounter: Payer: Self-pay | Admitting: Physician Assistant

## 2023-11-24 DIAGNOSIS — Z780 Asymptomatic menopausal state: Secondary | ICD-10-CM | POA: Insufficient documentation

## 2023-11-24 DIAGNOSIS — R1013 Epigastric pain: Secondary | ICD-10-CM | POA: Insufficient documentation

## 2023-11-24 LAB — CMP14+EGFR
ALT: 15 [IU]/L (ref 0–32)
AST: 20 [IU]/L (ref 0–40)
Albumin: 4.2 g/dL (ref 3.9–4.9)
Alkaline Phosphatase: 87 [IU]/L (ref 44–121)
BUN/Creatinine Ratio: 13 (ref 12–28)
BUN: 11 mg/dL (ref 8–27)
Bilirubin Total: 0.3 mg/dL (ref 0.0–1.2)
CO2: 22 mmol/L (ref 20–29)
Calcium: 9.4 mg/dL (ref 8.7–10.3)
Chloride: 103 mmol/L (ref 96–106)
Creatinine, Ser: 0.88 mg/dL (ref 0.57–1.00)
Globulin, Total: 2.8 g/dL (ref 1.5–4.5)
Glucose: 98 mg/dL (ref 70–99)
Potassium: 4.1 mmol/L (ref 3.5–5.2)
Sodium: 141 mmol/L (ref 134–144)
Total Protein: 7 g/dL (ref 6.0–8.5)
eGFR: 73 mL/min/{1.73_m2} (ref >59)

## 2023-11-24 LAB — LIPASE: Lipase: 38 U/L (ref 14–72)

## 2023-11-24 LAB — LIPID PANEL
Chol/HDL Ratio: 3.1 {ratio} (ref 0.0–4.4)
Cholesterol, Total: 274 mg/dL — ABNORMAL HIGH (ref 100–199)
HDL: 87 mg/dL (ref >39)
LDL Chol Calc (NIH): 177 mg/dL — ABNORMAL HIGH (ref 0–99)
Triglycerides: 64 mg/dL (ref 0–149)
VLDL Cholesterol Cal: 10 mg/dL (ref 5–40)

## 2023-11-24 LAB — VITAMIN D 25 HYDROXY (VIT D DEFICIENCY, FRACTURES): Vit D, 25-Hydroxy: 48.3 ng/mL (ref 30.0–100.0)

## 2023-11-24 LAB — CBC WITH DIFFERENTIAL/PLATELET
Basophils Absolute: 0.1 10*3/uL (ref 0.0–0.2)
Basos: 1 %
EOS (ABSOLUTE): 0.8 10*3/uL — ABNORMAL HIGH (ref 0.0–0.4)
Eos: 9 %
Hematocrit: 41.3 % (ref 34.0–46.6)
Hemoglobin: 13.5 g/dL (ref 11.1–15.9)
Immature Grans (Abs): 0 10*3/uL (ref 0.0–0.1)
Immature Granulocytes: 0 %
Lymphocytes Absolute: 2.7 10*3/uL (ref 0.7–3.1)
Lymphs: 30 %
MCH: 29.6 pg (ref 26.6–33.0)
MCHC: 32.7 g/dL (ref 31.5–35.7)
MCV: 91 fL (ref 79–97)
Monocytes Absolute: 0.7 10*3/uL (ref 0.1–0.9)
Monocytes: 7 %
Neutrophils Absolute: 4.7 10*3/uL (ref 1.4–7.0)
Neutrophils: 53 %
Platelets: 307 10*3/uL (ref 150–450)
RBC: 4.56 x10E6/uL (ref 3.77–5.28)
RDW: 12.2 % (ref 11.7–15.4)
WBC: 8.9 10*3/uL (ref 3.4–10.8)

## 2023-11-24 LAB — TSH+FREE T4
Free T4: 1.25 ng/dL (ref 0.82–1.77)
TSH: 2.04 u[IU]/mL (ref 0.450–4.500)

## 2023-11-24 NOTE — Progress Notes (Signed)
Please fax labs to Gastroenterologist.

## 2023-11-24 NOTE — Progress Notes (Signed)
April Norman,   Kidney, liver, glucose look good.  Thyroid normal Vitamin d looks great.   LDL not to goal. Your 10 year cardiovascular risk is elevated as well. You need to start a statin which can help lower your cholesterol. Are you ok with me sending?   Marland KitchenMarland KitchenThe 10-year ASCVD risk score (Arnett DK, et al., 2019) is: 14.2%   Values used to calculate the score:     Age: 67 years     Sex: Female     Is Non-Hispanic African American: No     Diabetic: No     Tobacco smoker: No     Systolic Blood Pressure: 182 mmHg     Is BP treated: Yes     HDL Cholesterol: 87 mg/dL     Total Cholesterol: 274 mg/dL

## 2023-12-02 ENCOUNTER — Telehealth: Payer: Self-pay | Admitting: Physician Assistant

## 2023-12-02 NOTE — Telephone Encounter (Signed)
Pt called back in she was returning a phone call from the office she would like a call back regarding this matter

## 2023-12-04 NOTE — Telephone Encounter (Signed)
Attempted call to patient. Left a voice mail to return our call regarding lab work results. ( This message in chart) =kph

## 2023-12-07 ENCOUNTER — Ambulatory Visit: Payer: PRIVATE HEALTH INSURANCE

## 2023-12-07 ENCOUNTER — Ambulatory Visit (INDEPENDENT_AMBULATORY_CARE_PROVIDER_SITE_OTHER): Payer: PRIVATE HEALTH INSURANCE

## 2023-12-07 ENCOUNTER — Other Ambulatory Visit: Payer: Self-pay | Admitting: Physician Assistant

## 2023-12-07 VITALS — BP 134/54 | HR 87

## 2023-12-07 DIAGNOSIS — I10 Essential (primary) hypertension: Secondary | ICD-10-CM

## 2023-12-07 MED ORDER — LOSARTAN POTASSIUM-HCTZ 50-12.5 MG PO TABS: 1.0000 | ORAL_TABLET | Freq: Every day | ORAL | 0 refills | Status: DC

## 2023-12-07 MED ORDER — ROSUVASTATIN CALCIUM 5 MG PO TABS
5.0000 mg | ORAL_TABLET | Freq: Every day | ORAL | 3 refills | Status: DC
Start: 2023-12-07 — End: 2024-06-29

## 2023-12-07 NOTE — Progress Notes (Signed)
   Established Patient Office Visit  Subjective   Patient ID: April Norman, female    DOB: [birth year]  Age: 67 y.o. MRN: [id number]  Chief Complaint  Patient presents with   Hypertension    HPI  April Norman is here for blood pressure check. Denies chest pain, shortness of breath or dizziness.   ROS    Objective:     BP (!) 134/54   Pulse 87   SpO2 100%    Physical Exam   No results found for any visits on 12/07/23.    The 10-year ASCVD risk score (Arnett DK, et al., 2019) is: 7.9%    Assessment & Plan:  HTN - Blood pressure was down to 134/54. Advised to continue medications as directed.   Elevated LDL - She has agreed to start a statin to reduce her cardiovascular risk.   Problem List Items Addressed This Visit   None Visit Diagnoses       Essential hypertension    -  Primary       Return in about 3 months (around 03/06/2024) for HTN with Jade. Earna Coder, Janalyn Harder, CMA

## 2023-12-08 NOTE — Telephone Encounter (Signed)
Again attempted call to patient. Left a detailed voice mail message ( allowed on DPR ) that we were calling with lab work results. Requested a return call.

## 2023-12-24 ENCOUNTER — Other Ambulatory Visit: Payer: Self-pay | Admitting: Physician Assistant

## 2023-12-24 DIAGNOSIS — I10 Essential (primary) hypertension: Secondary | ICD-10-CM

## 2024-01-11 ENCOUNTER — Ambulatory Visit (INDEPENDENT_AMBULATORY_CARE_PROVIDER_SITE_OTHER): Payer: PRIVATE HEALTH INSURANCE | Admitting: Physician Assistant

## 2024-01-11 ENCOUNTER — Telehealth: Payer: Self-pay

## 2024-01-11 VITALS — BP 134/88 | HR 99 | Temp 98.8°F | Ht 60.0 in | Wt 215.0 lb

## 2024-01-11 DIAGNOSIS — J101 Influenza due to other identified influenza virus with other respiratory manifestations: Secondary | ICD-10-CM

## 2024-01-11 DIAGNOSIS — R6889 Other general symptoms and signs: Secondary | ICD-10-CM

## 2024-01-11 DIAGNOSIS — U071 COVID-19: Secondary | ICD-10-CM | POA: Diagnosis not present

## 2024-01-11 LAB — POCT INFLUENZA A/B
Influenza A, POC: NEGATIVE
Influenza B, POC: POSITIVE — AB

## 2024-01-11 LAB — POC COVID19 BINAXNOW: SARS Coronavirus 2 Ag: POSITIVE — AB

## 2024-01-11 MED ORDER — NIRMATRELVIR/RITONAVIR (PAXLOVID)TABLET: 3.0000 | ORAL_TABLET | Freq: Two times a day (BID) | ORAL | 0 refills | Status: AC

## 2024-01-11 MED ORDER — HYDROCODONE BIT-HOMATROP MBR 5-1.5 MG/5ML PO SOLN: 5.0000 mL | Freq: Three times a day (TID) | ORAL | 0 refills | Status: DC | PRN

## 2024-01-11 MED ORDER — OSELTAMIVIR PHOSPHATE 75 MG PO CAPS: 75.0000 mg | ORAL_CAPSULE | Freq: Two times a day (BID) | ORAL | 0 refills | Status: DC

## 2024-01-11 NOTE — Telephone Encounter (Signed)
Spoke with sarah at pharmacy and forwarded message to provider in a separate message.

## 2024-01-11 NOTE — Patient Instructions (Signed)

## 2024-01-11 NOTE — Telephone Encounter (Signed)
Sarah pharmacist informed. And will make corrections of quantity on prescription

## 2024-01-11 NOTE — Telephone Encounter (Signed)
Sarah pharmacist states no other concerns with medications . Only concern was qty of tamiflu which has been resolved.

## 2024-01-11 NOTE — Telephone Encounter (Signed)
Spoke with sara at pharmacy  States tamiflu was written as #60 which would be one month supply and tamiflu is normally given as a 5 day supply.  Wanting to know if this is correct.   She also states she does not have this in stock but could get it by tomorrow - if provider is wanting patient to start medication tonight it might need to be sent to another pharmacy.

## 2024-01-11 NOTE — Progress Notes (Unsigned)
Acute Office Visit  Subjective:     Patient ID: April Norman, female    DOB: 1956-09-11, 68 y.o.   MRN: [id number]  Chief Complaint  Patient presents with   Cough    Flu like symptoms     HPI  Patient is in today for flu like sxs and a chest cold. These sxs first started after being exposed last week to a coworker with the flu. She states that last night she started to have some post nasal drip and a minor nonproductive cough. She is currently having slight pressure in her frontal sinuses with clear rhinorrhea. She states she took Tylenol last night and used her albuterol inhaler but they were not very helpful. She denies having a fever, chills, N/V/D, sore throat or otalgia.  She was vaccinated for flu but has not received covid booster.   She is concerned because her husband is receiving cancer treatments and is immunocompromised.   Review of Systems  Constitutional:  Negative for chills and fever.  HENT:  Positive for sinus pain. Negative for ear pain and sore throat.   Respiratory:  Positive for cough. Negative for sputum production and shortness of breath.   Gastrointestinal:  Negative for diarrhea, nausea and vomiting.  All other systems reviewed and are negative.     Objective:    BP 134/88   Pulse 99   Temp 98.8 F (37.1 C) (Oral)   Ht 5' (1.524 m)   Wt 215 lb (97.5 kg)   SpO2 99%   BMI 41.99 kg/m   BP Readings from Last 3 Encounters:  01/11/24 134/88  12/07/23 (!) 134/54  11/23/23 (!) 182/90   Wt Readings from Last 3 Encounters:  01/11/24 215 lb (97.5 kg)  05/04/23 207 lb (93.9 kg)  08/07/22 218 lb (98.9 kg)     Physical Exam Constitutional:      Appearance: Normal appearance.  HENT:     Head: Normocephalic.     Right Ear: Tympanic membrane normal.     Left Ear: Tympanic membrane normal.     Nose: Nose normal.     Mouth/Throat:     Mouth: Mucous membranes are moist.     Pharynx: Oropharynx is clear. No posterior oropharyngeal erythema.   Cardiovascular:     Rate and Rhythm: Normal rate.     Pulses: Normal pulses.     Heart sounds: Normal heart sounds.  Pulmonary:     Effort: Pulmonary effort is normal.     Breath sounds: Normal breath sounds.  Musculoskeletal:     Cervical back: Neck supple. No tenderness.  Lymphadenopathy:     Cervical: No cervical adenopathy.  Skin:    General: Skin is warm and dry.  Neurological:     General: No focal deficit present.     Mental Status: She is alert and oriented to person, place, and time.  Psychiatric:        Mood and Affect: Mood normal.        Behavior: Behavior normal.     Results for orders placed or performed in visit on 01/11/24  POCT Influenza A/B  Result Value Ref Range   Influenza A, POC Negative Negative   Influenza B, POC Positive (A) Negative  POC COVID-19  Result Value Ref Range   SARS Coronavirus 2 Ag Positive (A) Negative       Assessment & Plan:  Rhemy was seen today for cough.  Diagnoses and all orders for this visit:  COVID-19 -  nirmatrelvir/ritonavir (PAXLOVID) 20 x 150 MG & 10 x 100MG  TABS; Take 3 tablets by mouth 2 (two) times daily for 5 days. Patient GFR is 73. -     HYDROcodone bit-homatropine (HYCODAN) 5-1.5 MG/5ML syrup; Take 5 mLs by mouth every 8 (eight) hours as needed for cough.  Flu-like symptoms -     POCT Influenza A/B -     POC COVID-19 -     HYDROcodone bit-homatropine (HYCODAN) 5-1.5 MG/5ML syrup; Take 5 mLs by mouth every 8 (eight) hours as needed for cough.  Influenza B -     oseltamivir (TAMIFLU) 75 MG capsule; Take 1 capsule (75 mg total) by mouth 2 (two) times daily. -     HYDROcodone bit-homatropine (HYCODAN) 5-1.5 MG/5ML syrup; Take 5 mLs by mouth every 8 (eight) hours as needed for cough.   Positive for Flu B and COVID-19 Start Tamiflu and Paxlovid for flu and covid sxs Take Hycodan every 8 hrs as needed for cough Discussed quarantine for 5 days Encouraged pt to consider symptomatic care  Out of work for  the rest of this week; work note provided  Return if symptoms worsen or fail to improve.  Tandy Gaw, PA-C

## 2024-01-11 NOTE — Telephone Encounter (Signed)
Copied from CRM [id number]. Topic: Clinical - Medication Question >> Jan 11, 2024  2:45 PM Jorje Guild R wrote: Reason for CRM: [name] is calling about questions with several medication prescriptions received at the pharmacy. The phone was disconnected when I was trying to get ahold of CAL

## 2024-01-11 NOTE — Telephone Encounter (Signed)
Copied from CRM [id number]. Topic: Clinical - Medication Question >> Jan 11, 2024  2:50 PM Gildardo Pounds wrote: Reason for CRM: Maralyn Sago from pharmacy inquiring if oseltamivir (TAMIFLU) 75 MG capsule was written correctly. Calback num [phone]

## 2024-01-12 ENCOUNTER — Encounter: Payer: Self-pay | Admitting: Physician Assistant

## 2024-01-14 ENCOUNTER — Ambulatory Visit: Payer: PRIVATE HEALTH INSURANCE | Admitting: Sports Medicine

## 2024-01-18 ENCOUNTER — Ambulatory Visit (INDEPENDENT_AMBULATORY_CARE_PROVIDER_SITE_OTHER): Payer: PRIVATE HEALTH INSURANCE | Admitting: Sports Medicine

## 2024-01-18 ENCOUNTER — Ambulatory Visit: Payer: PRIVATE HEALTH INSURANCE

## 2024-01-18 DIAGNOSIS — M47816 Spondylosis without myelopathy or radiculopathy, lumbar region: Secondary | ICD-10-CM

## 2024-01-18 DIAGNOSIS — M25531 Pain in right wrist: Secondary | ICD-10-CM | POA: Insufficient documentation

## 2024-01-18 MED ORDER — DEXAMETHASONE 4 MG PO TABS: 4.0000 mg | ORAL_TABLET | Freq: Three times a day (TID) | ORAL | 0 refills | Status: DC

## 2024-01-18 NOTE — Progress Notes (Addendum)
    Procedures performed today:    None.  Independent interpretation of notes and tests performed by another provider:   None.  Brief History, Exam, Impression, and Recommendations:    Right wrist pain This is a pleasant 68 year old female with history of rheumatoid arthritis, about 6 weeks ago she fell onto an outstretched right hand, she fell again around Christmas. Since then she has had pain and swelling that she localizes along the radial aspect and dorsal aspect of her wrist. She is dropping things at work, she is a Armed forces operational officer. On exam she does have visible fusiform swelling of the right wrist, she has significant loss of motion, she has tenderness at her thumb basal joint, radiocarpal joint. Neurovascular intact, negative Finkelstein sign and negative Tinel sign. Unclear etiology, she does have a thumb spica brace, I would like her to wear this day and night, we will get x-rays. I will add some home PT. I am also going to call in Decadron (intolerant of prednisone) but I do not want her to start it until I get for the all clear that there is not a fracture. If insufficient improvement after approximately 6 more weeks we will consider MRI +/- thumb basal joint versus radiocarpal injection.  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  Lumbar spondylosis Right-sided axial low back pain with numbness and paresthesias right quadratus lumborum all worse with standing, nothing overtly radicular down to the legs Suspect lumbar spinal stenosis, adding lumbar x-rays, the Decadron for the wrist will likely help her back as well, home PT given, return to see me in 6 weeks,  MRI if not better.    ____________________________________________ Ihor Austin. Benjamin Stain, M.D., ABFM., CAQSM., AME. Primary Care and Sports Medicine [facility]  Adjunct Professor of Family Medicine  [employer] [facility] [employer]  Restaurant manager, fast food

## 2024-01-18 NOTE — Assessment & Plan Note (Addendum)
This is a pleasant 68 year old female with history of rheumatoid arthritis, about 6 weeks ago she fell onto an outstretched right hand, she fell again around Christmas. Since then she has had pain and swelling that she localizes along the radial aspect and dorsal aspect of her wrist. She is dropping things at work, she is a Armed forces operational officer. On exam she does have visible fusiform swelling of the right wrist, she has significant loss of motion, she has tenderness at her thumb basal joint, radiocarpal joint. Neurovascular intact, negative Finkelstein sign and negative Tinel sign. Unclear etiology, she does have a thumb spica brace, I would like her to wear this day and night, we will get x-rays. I will add some home PT. I am also going to call in Decadron (intolerant of prednisone) but I do not want her to start it until I get for the all clear that there is not a fracture. If insufficient improvement after approximately 6 more weeks we will consider MRI +/- thumb basal joint versus radiocarpal injection.  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

## 2024-01-18 NOTE — Assessment & Plan Note (Signed)
Right-sided axial low back pain with numbness and paresthesias right quadratus lumborum all worse with standing, nothing overtly radicular down to the legs Suspect lumbar spinal stenosis, adding lumbar x-rays, the Decadron for the wrist will likely help her back as well, home PT given, return to see me in 6 weeks, MRI if not better.

## 2024-01-25 ENCOUNTER — Encounter: Payer: Self-pay | Admitting: Sports Medicine

## 2024-02-15 ENCOUNTER — Ambulatory Visit (INDEPENDENT_AMBULATORY_CARE_PROVIDER_SITE_OTHER): Payer: PRIVATE HEALTH INSURANCE | Admitting: Sports Medicine

## 2024-02-15 DIAGNOSIS — M47816 Spondylosis without myelopathy or radiculopathy, lumbar region: Secondary | ICD-10-CM | POA: Diagnosis not present

## 2024-02-15 DIAGNOSIS — M25531 Pain in right wrist: Secondary | ICD-10-CM

## 2024-02-15 NOTE — Assessment & Plan Note (Signed)
 April Norman returns, she is a very pleasant 68 year old female, she has right sided axial low back pain with numbness and paresthesias right quadratus lumborum all worse with standing. Nothing overtly radicular down the legs, suspected more of a spinal stenosis versus facet arthropathy picture, x-rays did show lower lumbar spondylosis. Decadron and home PT have helped only slightly, proceeding with MRI.  For insurance purposes she has failed greater than 6 weeks of physician directed physical therapy as well as oral analgesics and steroids.

## 2024-02-15 NOTE — Assessment & Plan Note (Signed)
 Update on the right wrist, April Norman does have a history of rheumatoid arthritis, approximately 10 to 11 weeks ago she fell onto an outstretched right hand, fell again around Christmas. Since then she has had pain and swelling localized radial aspect dorsal aspect of the wrist, dropping things. 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 She did have some fusiform swelling of the wrist with significant loss of motion, tenderness at the thumb basal joint and radiocarpal joint. 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 Due to persistence of discomfort and spite of physician directed physical therapy we will proceed with MR arthrography for diagnostic and therapeutic purposes, question scapholunate tear.

## 2024-02-15 NOTE — Progress Notes (Signed)
    Procedures performed today:    None.  Independent interpretation of notes and tests performed by another provider:   None.  Brief History, Exam, Impression, and Recommendations:    Lumbar spondylosis Kemi returns, she is a very pleasant 68 year old female, she has right sided axial low back pain with numbness and paresthesias right quadratus lumborum all worse with standing. Nothing overtly radicular down the legs, suspected more of a spinal stenosis versus facet arthropathy picture, x-rays did show lower lumbar spondylosis. Decadron and home PT have helped only slightly, proceeding with MRI.  For insurance purposes she has failed greater than 6 weeks of physician directed physical therapy as well as oral analgesics and steroids.  Right wrist pain Update on the right wrist, Palin does have a history of rheumatoid arthritis, approximately 10 to 11 weeks ago she fell onto an outstretched right hand, fell again around Christmas. Since then she has had pain and swelling localized radial aspect dorsal aspect of the wrist, dropping things. 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 She did have some fusiform swelling of the wrist with significant loss of motion, tenderness at the thumb basal joint and radiocarpal joint. 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 Due to persistence of discomfort and spite of physician directed physical therapy we will proceed with MR arthrography for diagnostic and therapeutic purposes, question scapholunate tear.   Patient declines pain medication  ____________________________________________ Ihor Austin. Benjamin Stain, M.D., ABFM., CAQSM., AME. Primary Care and Sports Medicine [facility]  Adjunct Professor of  Family Medicine  [employer]  Restaurant manager, fast food

## 2024-03-07 ENCOUNTER — Ambulatory Visit: Payer: PRIVATE HEALTH INSURANCE | Admitting: Physician Assistant

## 2024-03-11 ENCOUNTER — Ambulatory Visit: Payer: PRIVATE HEALTH INSURANCE | Admitting: Physician Assistant

## 2024-03-14 ENCOUNTER — Ambulatory Visit (INDEPENDENT_AMBULATORY_CARE_PROVIDER_SITE_OTHER): Payer: PRIVATE HEALTH INSURANCE | Admitting: Physician Assistant

## 2024-03-14 VITALS — BP 160/80 | HR 97 | Ht 60.0 in | Wt 216.0 lb

## 2024-03-14 DIAGNOSIS — I10 Essential (primary) hypertension: Secondary | ICD-10-CM | POA: Insufficient documentation

## 2024-03-14 DIAGNOSIS — M0579 Rheumatoid arthritis with rheumatoid factor of multiple sites without organ or systems involvement: Secondary | ICD-10-CM | POA: Diagnosis not present

## 2024-03-14 DIAGNOSIS — R030 Elevated blood-pressure reading, without diagnosis of hypertension: Secondary | ICD-10-CM | POA: Diagnosis not present

## 2024-03-14 DIAGNOSIS — E66813 Obesity, class 3: Secondary | ICD-10-CM | POA: Diagnosis not present

## 2024-03-14 DIAGNOSIS — M47816 Spondylosis without myelopathy or radiculopathy, lumbar region: Secondary | ICD-10-CM

## 2024-03-14 DIAGNOSIS — Z6841 Body Mass Index (BMI) 40.0 and over, adult: Secondary | ICD-10-CM

## 2024-03-14 MED ORDER — LOSARTAN POTASSIUM-HCTZ 100-12.5 MG PO TABS
1.0000 | ORAL_TABLET | Freq: Every day | ORAL | 0 refills | Status: DC
Start: 2024-03-14 — End: 2024-06-10

## 2024-03-14 NOTE — Patient Instructions (Signed)
 Managing Your Hypertension Hypertension, also called high blood pressure, is when the force of the blood pressing against the walls of the arteries is too strong. Arteries are blood vessels that carry blood from your heart throughout your body. Hypertension forces the heart to work harder to pump blood and may cause the arteries to become narrow or stiff. Understanding blood pressure readings A blood pressure reading includes a higher number over a lower number: The first, or top, number is called the systolic pressure. It is a measure of the pressure in your arteries as your heart beats. The second, or bottom number, is called the diastolic pressure. It is a measure of the pressure in your arteries as the heart relaxes. For most people, a normal blood pressure is below 120/80. Your personal target blood pressure may vary depending on your medical conditions, your age, and other factors. Blood pressure is classified into four stages. Based on your blood pressure reading, your health care provider may use the following stages to determine what type of treatment you need, if any. Systolic pressure and diastolic pressure are measured in a unit called millimeters of mercury (mmHg). Normal Systolic pressure: below 120. Diastolic pressure: below 80. Elevated Systolic pressure: 120-129. Diastolic pressure: below 80. Hypertension stage 1 Systolic pressure: 130-139. Diastolic pressure: 80-89. Hypertension stage 2 Systolic pressure: 140 or above. Diastolic pressure: 90 or above. How can this condition affect me? Managing your hypertension is very important. Over time, hypertension can damage the arteries and decrease blood flow to parts of the body, including the brain, heart, and kidneys. Having untreated or uncontrolled hypertension can lead to: A heart attack. A stroke. A weakened blood vessel (aneurysm). Heart failure. Kidney damage. Eye damage. Memory and concentration problems. Vascular  dementia. What actions can I take to manage this condition? Hypertension can be managed by making lifestyle changes and possibly by taking medicines. Your health care provider will help you make a plan to bring your blood pressure within a normal range. You may be referred for counseling on a healthy diet and physical activity. Nutrition  Eat a diet that is high in fiber and potassium, and low in salt (sodium), added sugar, and fat. An example eating plan is called the DASH diet. DASH stands for Dietary Approaches to Stop Hypertension. To eat this way: Eat plenty of fresh fruits and vegetables. Try to fill one-half of your plate at each meal with fruits and vegetables. Eat whole grains, such as whole-wheat pasta, brown rice, or whole-grain bread. Fill about one-fourth of your plate with whole grains. Eat low-fat dairy products. Avoid fatty cuts of meat, processed or cured meats, and poultry with skin. Fill about one-fourth of your plate with lean proteins such as fish, chicken without skin, beans, eggs, and tofu. Avoid pre-made and processed foods. These tend to be higher in sodium, added sugar, and fat. Reduce your daily sodium intake. Many people with hypertension should eat less than 1,500 mg of sodium a day. Lifestyle  Work with your health care provider to maintain a healthy body weight or to lose weight. Ask what an ideal weight is for you. Get at least 30 minutes of exercise that causes your heart to beat faster (aerobic exercise) most days of the week. Activities may include walking, swimming, or biking. Include exercise to strengthen your muscles (resistance exercise), such as weight lifting, as part of your weekly exercise routine. Try to do these types of exercises for 30 minutes at least 3 days a week. Do  not use any products that contain nicotine or tobacco. These products include cigarettes, chewing tobacco, and vaping devices, such as e-cigarettes. If you need help quitting, ask your  health care provider. Control any long-term (chronic) conditions you have, such as high cholesterol or diabetes. Identify your sources of stress and find ways to manage stress. This may include meditation, deep breathing, or making time for fun activities. Alcohol use Do not drink alcohol if: Your health care provider tells you not to drink. You are pregnant, may be pregnant, or are planning to become pregnant. If you drink alcohol: Limit how much you have to: 0-1 drink a day for women. 0-2 drinks a day for men. Know how much alcohol is in your drink. In the [place] one drink equals one 12 oz bottle of beer (355 mL), one 5 oz glass of wine (148 mL), or one 1 oz glass of hard liquor (44 mL). Medicines Your health care provider may prescribe medicine if lifestyle changes are not enough to get your blood pressure under control and if: Your systolic blood pressure is 130 or higher. Your diastolic blood pressure is 80 or higher. Take medicines only as told by your health care provider. Follow the directions carefully. Blood pressure medicines must be taken as told by your health care provider. The medicine does not work as well when you skip doses. Skipping doses also puts you at risk for problems. Monitoring Before you monitor your blood pressure: Do not smoke, drink caffeinated beverages, or exercise within 30 minutes before taking a measurement. Use the bathroom and empty your bladder (urinate). Sit quietly for at least 5 minutes before taking measurements. Monitor your blood pressure at home as told by your health care provider. To do this: Sit with your back straight and supported. Place your feet flat on the floor. Do not cross your legs. Support your arm on a flat surface, such as a table. Make sure your upper arm is at heart level. Each time you measure, take two or three readings one minute apart and record the results. You may also need to have your blood pressure checked regularly by  your health care provider. General information Talk with your health care provider about your diet, exercise habits, and other lifestyle factors that may be contributing to hypertension. Review all the medicines you take with your health care provider because there may be side effects or interactions. Keep all follow-up visits. Your health care provider can help you create and adjust your plan for managing your high blood pressure. Where to find more information National Heart, Lung, and Blood Institute: PopSteam.is American Heart Association: www.heart.org Contact a health care provider if: You think you are having a reaction to medicines you have taken. You have repeated (recurrent) headaches. You feel dizzy. You have swelling in your ankles. You have trouble with your vision. Get help right away if: You develop a severe headache or confusion. You have unusual weakness or numbness, or you feel faint. You have severe pain in your chest or abdomen. You vomit repeatedly. You have trouble breathing. These symptoms may be an emergency. Get help right away. Call 911. Do not wait to see if the symptoms will go away. Do not drive yourself to the hospital. Summary Hypertension is when the force of blood pumping through your arteries is too strong. If this condition is not controlled, it may put you at risk for serious complications. Your personal target blood pressure may vary depending on your medical conditions,  your age, and other factors. For most people, a normal blood pressure is less than 120/80. Hypertension is managed by lifestyle changes, medicines, or both. Lifestyle changes to help manage hypertension include losing weight, eating a healthy, low-sodium diet, exercising more, stopping smoking, and limiting alcohol. This information is not intended to replace advice given to you by your health care provider. Make sure you discuss any questions you have with your health care  provider. Document Revised: 08/22/2021 Document Reviewed: 08/22/2021 Elsevier Patient Education  2024 [employer].

## 2024-03-14 NOTE — Progress Notes (Unsigned)
   Established Patient Office Visit  Subjective   Patient ID: EVANELL REDLICH, female    DOB: [birth year]  Age: 68 y.o. MRN: [id number]  Chief Complaint  Patient presents with  . Medical Management of Chronic Issues    3 mo fup hypertension    HPI  {History (Optional):23778}  ROS    Objective:     BP (!) 178/78   Pulse 97   Ht 5' (1.524 m)   Wt 216 lb (98 kg)   SpO2 99%   BMI 42.18 kg/m  {Vitals History (Optional):23777}  Physical Exam   No results found for any visits on 03/14/24.  {Labs (Optional):23779}  The 10-year ASCVD risk score (Arnett DK, et al., 2019) is: 13.6%    Assessment & Plan:   Problem List Items Addressed This Visit       Unprioritized   Rheumatoid arthritis (HCC)   Lumbar spondylosis   Other Visit Diagnoses       Essential hypertension    -  Primary       No follow-ups on file.    Tandy Gaw, PA-C

## 2024-03-15 ENCOUNTER — Encounter: Payer: Self-pay | Admitting: Physician Assistant

## 2024-03-28 ENCOUNTER — Ambulatory Visit (INDEPENDENT_AMBULATORY_CARE_PROVIDER_SITE_OTHER): Payer: PRIVATE HEALTH INSURANCE | Admitting: Physician Assistant

## 2024-03-28 VITALS — BP 128/77 | HR 82 | Resp 20

## 2024-03-28 DIAGNOSIS — I10 Essential (primary) hypertension: Secondary | ICD-10-CM

## 2024-03-28 NOTE — Progress Notes (Unsigned)
   Subjective:    Patient ID: April Norman, female    DOB: April 05, 1956, 68 y.o.   MRN: [id number]  HPI  Patient is here for blood pressure check. Denies trouble sleeping, palpitations or medication problems.   Review of Systems     Objective:   Physical Exam        Assessment & Plan:   Patient advised to keep a BP reading log and bring to her upcoming appointment with her PCP 06/10/2024.

## 2024-03-29 ENCOUNTER — Encounter: Payer: Self-pay | Admitting: Physician Assistant

## 2024-03-29 NOTE — Progress Notes (Signed)
 BP looks great in office. Continue to keep BP log. Stay on same dose and follow up in office in one month.

## 2024-04-11 ENCOUNTER — Telehealth: Payer: Self-pay | Admitting: Sports Medicine

## 2024-04-11 NOTE — Telephone Encounter (Signed)
 Copied from CRM [id number]. Topic: Referral - Question >> Apr 11, 2024  1:24 PM Brynn Caras wrote: Reason for CRM: The patient states she had a referral placed on 02/25 to complete an MRI on her hip/right wrist and this was scheduled by Dr. Elva Hamburger at [facility]. She states she received a call from [facility] to conduct the MRI but they will need the doctor's notes faxed over? Callback [phone]

## 2024-04-21 ENCOUNTER — Telehealth: Payer: Self-pay | Admitting: Physician Assistant

## 2024-04-21 ENCOUNTER — Telehealth: Payer: Self-pay

## 2024-04-21 NOTE — Telephone Encounter (Signed)
 Requesting call to Health in Authorization depart at [facility]  Phone # [phone] Have not received the PA approval for MRI lumbar spine without contrast  States to just call above number and ask for heather in authorizations

## 2024-04-21 NOTE — Telephone Encounter (Signed)
 P2P conducted just now, MRI approved for [facility]. Expires 08/22/24.    Case [id number]  Auth: [id number]

## 2024-04-21 NOTE — Telephone Encounter (Signed)
 Copied from CRM [id number]. Topic: General - Other >> Apr 21, 2024  8:59 AM Retta Caster wrote: Reason for CRM: MR LUMBAR SPINE WO CONTRAST (Order [id number])  Pattie Borders from [facility] imaging calling for clarification for approval for this order. Called office and transferred

## 2024-04-21 NOTE — Telephone Encounter (Signed)
 Heather from [employer] Author[employer] called in stating that she was able to get the MRI Wrist approved but for the MRI Lumbar; the patient insurance is requesting more information. Pattie Borders stated that she sent in the notes about the patient failing greater than 6 weeks of physician directed physical therapy as well as oral analgesics and steroids but they are still requesting additional information.  Pattie Borders is suggesting to do a Peer-to-Peer instead since the first MRI Lumbar was denied.

## 2024-04-21 NOTE — Telephone Encounter (Signed)
 That is fine but I do need the phone number to call for the peer to peer as well as there is typically a case number or reference number.

## 2024-04-22 NOTE — Telephone Encounter (Signed)
 Patient has been advised of approval. Patient has scheduled MRI for 05/21/2024.

## 2024-04-25 ENCOUNTER — Telehealth: Payer: Self-pay

## 2024-04-25 NOTE — Telephone Encounter (Signed)
 The patient will need to contact their insurance directly for locations that perform intra-articular joint injection. Once she has a location, a new auth will need to be obtained by the Referral team for the updated location/site.

## 2024-04-25 NOTE — Telephone Encounter (Signed)
 Brian Campanile with [facility]  [phone] States MRI of wrist order needs to be changed to without contrast.  States per protocol wrist pain needs to be ordered as without contrast.  Can fax updated order to [phone]

## 2024-04-25 NOTE — Telephone Encounter (Signed)
 Spoke with [facility], they stated they do not do intraarticular contrast for MRIs. Advised them to contact the patient to let her decide if she still wants to have her lumbar mri at their facility as it is without contrast. We will check with the imaging authorization team about getting this patient rescheduled for her wrist MRI and, per Dr. Elva Hamburger, do the intraarticular joint injection here if we have to.

## 2024-04-27 NOTE — Telephone Encounter (Signed)
 Patient is aware that she needs to contact her insurance to find a facility that will do a intraarticular joint injection for the wrist MRI. Once the insurance has approved a facility, we can re-authorize the MRI and get her scheduled. She verbalized understanding.

## 2024-04-29 ENCOUNTER — Other Ambulatory Visit: Payer: Self-pay | Admitting: Physician Assistant

## 2024-04-29 DIAGNOSIS — Z1231 Encounter for screening mammogram for malignant neoplasm of breast: Secondary | ICD-10-CM

## 2024-05-19 ENCOUNTER — Ambulatory Visit
Admission: RE | Admit: 2024-05-19 | Discharge: 2024-05-19 | Disposition: A | Discharge status: Home / Self Care | Payer: PRIVATE HEALTH INSURANCE | Source: Ambulatory Visit | Attending: Physician Assistant | Admitting: Physician Assistant

## 2024-05-19 DIAGNOSIS — Z1231 Encounter for screening mammogram for malignant neoplasm of breast: Secondary | ICD-10-CM

## 2024-05-23 ENCOUNTER — Ambulatory Visit: Payer: Self-pay | Admitting: Physician Assistant

## 2024-05-23 NOTE — Progress Notes (Signed)
 Normal mammogram. Follow up in 1 year.

## 2024-06-10 ENCOUNTER — Ambulatory Visit (INDEPENDENT_AMBULATORY_CARE_PROVIDER_SITE_OTHER): Payer: PRIVATE HEALTH INSURANCE | Admitting: Physician Assistant

## 2024-06-10 ENCOUNTER — Ambulatory Visit: Payer: PRIVATE HEALTH INSURANCE | Admitting: Physician Assistant

## 2024-06-10 ENCOUNTER — Encounter: Payer: Self-pay | Admitting: Physician Assistant

## 2024-06-10 VITALS — BP 128/57 | HR 78 | Ht 60.0 in | Wt 209.1 lb

## 2024-06-10 DIAGNOSIS — E66813 Obesity, class 3: Secondary | ICD-10-CM

## 2024-06-10 DIAGNOSIS — I10 Essential (primary) hypertension: Secondary | ICD-10-CM

## 2024-06-10 DIAGNOSIS — Z79899 Other long term (current) drug therapy: Secondary | ICD-10-CM | POA: Diagnosis not present

## 2024-06-10 DIAGNOSIS — Z6841 Body Mass Index (BMI) 40.0 and over, adult: Secondary | ICD-10-CM | POA: Diagnosis not present

## 2024-06-10 DIAGNOSIS — M0579 Rheumatoid arthritis with rheumatoid factor of multiple sites without organ or systems involvement: Secondary | ICD-10-CM

## 2024-06-10 MED ORDER — LOSARTAN POTASSIUM-HCTZ 100-12.5 MG PO TABS: 1.0000 | ORAL_TABLET | Freq: Every day | ORAL | 1 refills | Status: DC

## 2024-06-10 NOTE — Progress Notes (Unsigned)
 Established Patient Office Visit  Subjective   Patient ID: April Norman, female    DOB: 1956-04-09  Age: 68 y.o. MRN: [id number]  Chief Complaint  Patient presents with   Medical Management of Chronic Issues    Essential hypertension    HPI Pt is a 68 yo obese female who presents to the clinic to follow up on HTN. Pt is very compliant with hyzaar. She denies any CP, palpitations, headaches and vision changes. She is not exercising regular but just retired and plans to do so with her husband soon.   .. Active Ambulatory Problems    Diagnosis Date Noted   Rheumatoid arthritis (HCC) 06/05/2014   Asthma 06/05/2014   Osteopenia 08/01/2014   Asthma, chronic 01/02/2015   Hyperplastic colon polyp 01/02/2015   Nodule of external ear 10/12/2015   Seborrheic dermatitis 10/12/2015   Left foot pain 10/15/2015   Vitamin D  deficiency 07/03/2017   Elevated LDL cholesterol level 07/13/2017   Acute telogen effluvium 01/15/2018   Eczema 04/19/2018   Class 3 severe obesity due to excess calories without serious comorbidity with body mass index (BMI) of 40.0 to 44.9 in adult 08/17/2018   Eosinophilic esophagitis 09/14/2018   Cough 12/06/2019   Abnormal weight gain 08/10/2020   Elevated blood pressure reading 08/10/2020   Obesity (BMI 35.0-39.9 without comorbidity) 03/26/2021   Rash and nonspecific skin eruption 04/26/2021   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      Objective:     BP (!) 128/57   Pulse 78   Ht 5' (1.524 m)   Wt 209 lb 1.9 oz (94.9 kg)   SpO2 100%   BMI 40.84 kg/m  BP Readings from Last 3 Encounters:  06/10/24 (!) 128/57  03/28/24 128/77  03/14/24 (!) 160/80   Wt Readings from Last 3 Encounters:  06/10/24 209 lb 1.9 oz (94.9 kg)  03/14/24 216 lb (98 kg)  01/11/24 215 lb (97.5 kg)      Physical Exam Constitutional:      Appearance: Normal appearance. She is obese.  HENT:     Head: Normocephalic.   Cardiovascular:     Rate and Rhythm: Normal rate and regular rhythm.  Pulmonary:     Effort: Pulmonary effort is normal.     Breath sounds: Normal breath sounds.   Musculoskeletal:     Right lower leg: No edema.     Left lower leg: No edema.   Neurological:     General: No focal deficit present.     Mental Status: She is alert and oriented to person, place, and time.   Psychiatric:        Mood and Affect: Mood normal.      The 10-year ASCVD risk score (Arnett DK, et al., 2019) is: 7.3%    Assessment & Plan:  [name] was seen today for medical management of chronic issues.  Diagnoses and all orders for this visit:  Essential hypertension -     losartan -hydrochlorothiazide (HYZAAR)  100-12.5 MG tablet; Take 1 tablet by mouth daily. -     CMP14+EGFR  Class 3 severe obesity due to excess calories without serious comorbidity with body mass index (BMI) of 40.0 to 44.9 in adult  Medication management -     CMP14+EGFR   Vitals look good Hyzaar refilled Cmp ordered for monitoring of kidney function Discussed importance of healthy weight Encouraged patient to walk 30 minutes a day Encouraged patient to limit sugars and carbs   Return in about 6 months (around 12/10/2024).    Gabby Rackers, PA-C

## 2024-06-11 LAB — CMP14+EGFR
ALT: 19 IU/L (ref 0–32)
AST: 19 IU/L (ref 0–40)
Albumin: 3.8 g/dL — ABNORMAL LOW (ref 3.9–4.9)
Alkaline Phosphatase: 84 IU/L (ref 44–121)
BUN/Creatinine Ratio: 21 (ref 12–28)
BUN: 18 mg/dL (ref 8–27)
Bilirubin Total: 0.3 mg/dL (ref 0.0–1.2)
CO2: 24 mmol/L (ref 20–29)
Calcium: 9.4 mg/dL (ref 8.7–10.3)
Chloride: 101 mmol/L (ref 96–106)
Creatinine, Ser: 0.85 mg/dL (ref 0.57–1.00)
Globulin, Total: 2.5 g/dL (ref 1.5–4.5)
Glucose: 82 mg/dL (ref 70–99)
Potassium: 4.1 mmol/L (ref 3.5–5.2)
Sodium: 141 mmol/L (ref 134–144)
Total Protein: 6.3 g/dL (ref 6.0–8.5)
eGFR: 76 mL/min/{1.73_m2} (ref >59)

## 2024-06-13 ENCOUNTER — Ambulatory Visit: Payer: Self-pay | Admitting: Physician Assistant

## 2024-06-13 NOTE — Progress Notes (Signed)
 Albumin(protein) is low. Make sure to increase protein in diet. Goal 80g a day. Recheck in 3 months with labs.

## 2024-06-23 ENCOUNTER — Other Ambulatory Visit: Payer: Self-pay

## 2024-06-23 DIAGNOSIS — I10 Essential (primary) hypertension: Secondary | ICD-10-CM

## 2024-06-23 MED ORDER — LOSARTAN POTASSIUM-HCTZ 100-12.5 MG PO TABS
1.0000 | ORAL_TABLET | Freq: Every day | ORAL | 0 refills | Status: DC
Start: 2024-06-23 — End: 2024-10-21

## 2024-06-28 ENCOUNTER — Telehealth: Payer: Self-pay

## 2024-06-28 NOTE — Telephone Encounter (Signed)
 Copied from CRM [id number]. Topic: Clinical - Lab/Test Results >> Jun 28, 2024  1:30 PM Carrielelia G wrote: Attn: Nurse Kim  Read message to [facility]  Albumin(protein) is low. Make sure to increase protein in diet. Goal 80g a day. Recheck in 3 months with labs.   She still would like a call back from you  Please advise.

## 2024-06-29 ENCOUNTER — Other Ambulatory Visit: Payer: Self-pay

## 2024-06-29 MED ORDER — ROSUVASTATIN CALCIUM 5 MG PO TABS: 5.0000 mg | ORAL_TABLET | Freq: Every day | ORAL | 0 refills | Status: DC

## 2024-06-29 NOTE — Telephone Encounter (Signed)
 Spoke with patient. Answered questions. Also sent 90 day supply of Rosuvastatin  to [facility] as requested.

## 2024-07-25 DIAGNOSIS — L218 Other seborrheic dermatitis: Secondary | ICD-10-CM | POA: Diagnosis not present

## 2024-07-25 DIAGNOSIS — D485 Neoplasm of uncertain behavior of skin: Secondary | ICD-10-CM | POA: Diagnosis not present

## 2024-07-25 DIAGNOSIS — L578 Other skin changes due to chronic exposure to nonionizing radiation: Secondary | ICD-10-CM | POA: Diagnosis not present

## 2024-08-08 DIAGNOSIS — Z79899 Other long term (current) drug therapy: Secondary | ICD-10-CM | POA: Diagnosis not present

## 2024-08-08 DIAGNOSIS — Z6838 Body mass index (BMI) 38.0-38.9, adult: Secondary | ICD-10-CM | POA: Diagnosis not present

## 2024-08-08 DIAGNOSIS — K52831 Collagenous colitis: Secondary | ICD-10-CM | POA: Diagnosis not present

## 2024-08-08 DIAGNOSIS — M0579 Rheumatoid arthritis with rheumatoid factor of multiple sites without organ or systems involvement: Secondary | ICD-10-CM | POA: Diagnosis not present

## 2024-08-08 DIAGNOSIS — E669 Obesity, unspecified: Secondary | ICD-10-CM | POA: Diagnosis not present

## 2024-08-23 ENCOUNTER — Encounter: Payer: Self-pay | Admitting: Sports Medicine

## 2024-09-12 ENCOUNTER — Other Ambulatory Visit: Payer: Self-pay | Admitting: Physician Assistant

## 2024-09-12 NOTE — Telephone Encounter (Unsigned)
 Copied from CRM #[id number]. Topic: Clinical - Medication Refill >> Sep 12, 2024  3:56 PM Antonio H wrote: Medication: rosuvastatin  (CRESTOR ) 5 MG tablet  Has the patient contacted their pharmacy? No (Agent: If no, request that the patient contact the pharmacy for the refill. If patient does not wish to contact the pharmacy document the reason why and proceed with request.) (Agent: If yes, when and what did the pharmacy advise?)  This is the patient's preferred pharmacy:   [id number] - [place], [place] - [address] AT [facility] OF [address] [place] [address] Phone: [phone] Fax: [phone]   Is this the correct pharmacy for this prescription? Yes If no, delete pharmacy and type the correct one.   Has the prescription been filled recently? No  Is the patient out of the medication? Yes  Has the patient been seen for an appointment in the last year OR does the patient have an upcoming appointment? Yes  Can we respond through MyChart? Yes  Agent: Please be advised that Rx refills may take up to 3 business days. We ask that you follow-up with your pharmacy.

## 2024-09-14 MED ORDER — ROSUVASTATIN CALCIUM 5 MG PO TABS: 5.0000 mg | ORAL_TABLET | Freq: Every day | ORAL | 1 refills | Status: AC

## 2024-09-30 ENCOUNTER — Other Ambulatory Visit: Payer: Self-pay

## 2024-09-30 ENCOUNTER — Ambulatory Visit: Payer: PRIVATE HEALTH INSURANCE | Admitting: Physician Assistant

## 2024-09-30 DIAGNOSIS — R770 Abnormality of albumin: Secondary | ICD-10-CM

## 2024-10-01 LAB — CMP14+EGFR
ALT: 13 IU/L (ref 0–32)
AST: 18 IU/L (ref 0–40)
Albumin: 3.8 g/dL — ABNORMAL LOW (ref 3.9–4.9)
Alkaline Phosphatase: 100 IU/L (ref 49–135)
BUN/Creatinine Ratio: 14 (ref 12–28)
BUN: 13 mg/dL (ref 8–27)
Bilirubin Total: 0.4 mg/dL (ref 0.0–1.2)
CO2: 27 mmol/L (ref 20–29)
Calcium: 9.6 mg/dL (ref 8.7–10.3)
Chloride: 100 mmol/L (ref 96–106)
Creatinine, Ser: 0.91 mg/dL (ref 0.57–1.00)
Globulin, Total: 2.6 g/dL (ref 1.5–4.5)
Glucose: 98 mg/dL (ref 70–99)
Potassium: 4.3 mmol/L (ref 3.5–5.2)
Sodium: 142 mmol/L (ref 134–144)
Total Protein: 6.4 g/dL (ref 6.0–8.5)
eGFR: 70 mL/min/1.73 (ref >59)

## 2024-10-03 ENCOUNTER — Ambulatory Visit: Payer: Self-pay | Admitting: Physician Assistant

## 2024-10-03 NOTE — Progress Notes (Signed)
 Albumin the same and still a little low. How many grams of protein are you getting a day?

## 2024-10-13 DIAGNOSIS — K52831 Collagenous colitis: Secondary | ICD-10-CM | POA: Diagnosis not present

## 2024-10-13 DIAGNOSIS — Z79899 Other long term (current) drug therapy: Secondary | ICD-10-CM | POA: Diagnosis not present

## 2024-10-13 DIAGNOSIS — Z6837 Body mass index (BMI) 37.0-37.9, adult: Secondary | ICD-10-CM | POA: Diagnosis not present

## 2024-10-13 DIAGNOSIS — E669 Obesity, unspecified: Secondary | ICD-10-CM | POA: Diagnosis not present

## 2024-10-13 DIAGNOSIS — M0579 Rheumatoid arthritis with rheumatoid factor of multiple sites without organ or systems involvement: Secondary | ICD-10-CM | POA: Diagnosis not present

## 2024-10-21 ENCOUNTER — Other Ambulatory Visit: Payer: Self-pay | Admitting: Physician Assistant

## 2024-10-21 DIAGNOSIS — I10 Essential (primary) hypertension: Secondary | ICD-10-CM

## 2024-10-21 NOTE — Telephone Encounter (Signed)
 Copied from CRM #[id number]. Topic: Clinical - Medication Refill >> Oct 21, 2024  8:47 AM Larissa S wrote: Medication: losartan -hydrochlorothiazide (HYZAAR) 100-12.5 MG tablet  Has the patient contacted their pharmacy? Yes (Agent: If no, request that the patient contact the pharmacy for the refill. If patient does not wish to contact the pharmacy document the reason why and proceed with request.) (Agent: If yes, when and what did the pharmacy advise?)  This is the patient's preferred pharmacy:    [facility] #[id number] - [place], [place] - [address] AT [facility] OF [address] [place] [address] Phone: [phone] Fax: [phone]   Is this the correct pharmacy for this prescription? Yes If no, delete pharmacy and type the correct one.   Has the prescription been filled recently? No  Is the patient out of the medication? Yes  Has the patient been seen for an appointment in the last year OR does the patient have an upcoming appointment? Yes  Can we respond through MyChart? No  Agent: Please be advised that Rx refills may take up to 3 business days. We ask that you follow-up with your pharmacy.

## 2024-10-24 MED ORDER — LOSARTAN POTASSIUM-HCTZ 100-12.5 MG PO TABS: 1.0000 | ORAL_TABLET | Freq: Every day | ORAL | 0 refills | Status: DC

## 2024-11-10 DIAGNOSIS — M0579 Rheumatoid arthritis with rheumatoid factor of multiple sites without organ or systems involvement: Secondary | ICD-10-CM | POA: Diagnosis not present

## 2024-11-28 ENCOUNTER — Encounter: Payer: Self-pay | Admitting: Urgent Care

## 2024-11-28 ENCOUNTER — Ambulatory Visit: Payer: PRIVATE HEALTH INSURANCE | Admitting: Urgent Care

## 2024-11-28 VITALS — BP 116/72 | HR 75 | Temp 98.1°F | Ht 60.0 in | Wt 198.0 lb

## 2024-11-28 DIAGNOSIS — J4521 Mild intermittent asthma with (acute) exacerbation: Secondary | ICD-10-CM

## 2024-11-28 LAB — POC COVID19/FLU A&B COMBO
Covid Antigen, POC: NEGATIVE
Influenza A Antigen, POC: NEGATIVE
Influenza B Antigen, POC: NEGATIVE

## 2024-11-28 MED ORDER — LEVOCETIRIZINE DIHYDROCHLORIDE 5 MG PO TABS: 5.0000 mg | ORAL_TABLET | Freq: Every evening | ORAL | 0 refills | Status: AC

## 2024-11-28 MED ORDER — AZITHROMYCIN 250 MG PO TABS: ORAL_TABLET | ORAL | 0 refills | Status: AC

## 2024-11-28 MED ORDER — HYDROCOD POLI-CHLORPHE POLI ER 10-8 MG/5ML PO SUER: 5.0000 mL | Freq: Two times a day (BID) | ORAL | 0 refills | Status: AC | PRN

## 2024-11-28 NOTE — Patient Instructions (Addendum)
 Please read the attached handout. Since you cannot take prednisone, I will give you azithromycin . This helps with inflammation of the airway.  I have also called in Xyzal  for you. Please take at night as it can make you sleepy.  Continue mucinex twice daily with plenty of water. Use the cough medication as needed - can cause drowsiness.  Start using your inhaler 2 puffs every 4-6 hours.  Your flu and covid test are normal.

## 2024-11-28 NOTE — Progress Notes (Unsigned)
 Established Patient Office Visit  Subjective:  Patient ID: April Norman, female    DOB: Sep 02, 1956  Age: 68 y.o. MRN: [id number]  Chief Complaint  Patient presents with   Cough    X 3-4 days with sinus drainage    Cough  Discussed the use of AI scribe software for clinical note transcription with the patient, who gave verbal consent to proceed.  History of Present Illness   April Norman is a 68 year old female who presents with worsening respiratory symptoms and nasal congestion.  She has been experiencing respiratory symptoms for the past four to five days, characterized by a productive cough with brown or tan sputum, especially in the morning. She has tried over-the-counter medications such as Theraflu and Sudafed without relief. No blood in the sputum is noted.  Significant nasal congestion is present, particularly at night, causing mouth breathing and a sore throat upon waking. Despite thorough cleaning of her house after her daughter's visit with dogs, to which she is allergic, her symptoms have worsened. She experiences a persistent headache, described as a standard headache rather than sinusitis-type pain.  No ear pain, pressure, sneezing, or body aches are present, but she reports fatigue and feeling 'give out' due to the illness. No fever is present. She has allergies to oral prednisone, Singulair , and Symbicort , and is not using her Ventolin  or albuterol  inhaler as she does not feel wheezy or tight in the chest.  Her current medications include hydrochlorothiazide and sulfasalazine  for arthritis. She discontinued budesonide  due to severe diarrhea and is not on any IV infusions recently.       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   Rash and nonspecific skin eruption 04/26/2021   Obesity (BMI 35.0-39.9 without comorbidity) 03/26/2021   Abnormal weight gain 08/10/2020   Elevated blood pressure reading 08/10/2020   Cough 12/06/2019   Eosinophilic esophagitis 09/14/2018   Class 3 severe obesity due to excess calories without serious comorbidity with body mass index (BMI) of 40.0 to 44.9 in adult [facility]) 08/17/2018   Eczema 04/19/2018   Acute telogen effluvium 01/15/2018   Elevated LDL cholesterol level 07/13/2017   Vitamin D  deficiency 07/03/2017   Left foot pain 10/15/2015   Nodule of external ear 10/12/2015   Seborrheic dermatitis 10/12/2015   Asthma, chronic 01/02/2015   Hyperplastic colon polyp 01/02/2015   Osteopenia 08/01/2014   Rheumatoid arthritis (HCC) 06/05/2014   Asthma 06/05/2014   Past Medical History:  Diagnosis Date   Asthma    Rheumatoid arthritis (HCC)    Vertigo, labyrinthine    Past Surgical History:  Procedure Laterality Date   ABDOMINAL HYSTERECTOMY     BREAST BIOPSY Right 04/03/2022   times 2   CHOLECYSTECTOMY     Social History   Tobacco Use   Smoking status: Former   Smokeless tobacco: Former  Substance Use Topics   Alcohol use: No   Drug use: No      ROS: as noted in HPI  Objective:     BP 116/72   Pulse 75   Temp 98.1 F (36.7 C) (Oral)   Ht 5' (1.524 m)   Wt 198 lb (89.8 kg)   SpO2 98%   BMI 38.67 kg/m  BP Readings from Last 3 Encounters:  11/28/24 116/72  06/10/24 (!) 128/57  03/28/24 128/77   Wt Readings from Last 3 Encounters:  11/28/24 198 lb (89.8 kg)  06/10/24 209 lb 1.9 oz (94.9 kg)  03/14/24 216 lb (98 kg)      Physical Exam Vitals and nursing note reviewed. Exam conducted with a chaperone present.  Constitutional:      General: She is not in acute distress.    Appearance: Normal appearance. She is not ill-appearing, toxic-appearing or diaphoretic.  HENT:     Head: Normocephalic  and atraumatic.     Right Ear: Ear canal and external ear normal. No drainage, swelling or tenderness. A middle ear effusion is present. There is no impacted cerumen. Tympanic membrane is not injected, scarred, perforated or erythematous.     Left Ear: Ear canal and external ear normal. No drainage, swelling or tenderness. A middle ear effusion is present. There is no impacted cerumen. Tympanic membrane is not injected, scarred, perforated or erythematous.     Nose: Congestion and rhinorrhea present.     Right Turbinates: Not enlarged or swollen.     Left Turbinates: Not enlarged or swollen.     Right Sinus: No maxillary sinus tenderness or frontal sinus tenderness.     Left Sinus: No maxillary sinus tenderness or frontal sinus tenderness.     Mouth/Throat:     Lips: Pink.     Mouth: Mucous membranes are moist.     Pharynx: Oropharynx is clear. Uvula midline. Postnasal drip present. No pharyngeal swelling, oropharyngeal exudate, posterior oropharyngeal erythema or uvula swelling.  Eyes:     General: No scleral icterus.       Right eye: No discharge.        Left eye: No discharge.     Extraocular Movements: Extraocular movements intact.     Pupils: Pupils are equal, round, and reactive to light.  Cardiovascular:     Rate and Rhythm: Normal rate and regular rhythm.  Pulmonary:     Effort: Pulmonary effort is normal. No respiratory distress.     Breath sounds: No stridor. Wheezing present. No rhonchi or rales.  Musculoskeletal:     Cervical back: Normal range of motion and neck supple. No rigidity or tenderness.  Lymphadenopathy:     Cervical: Cervical adenopathy (submandibular B) present.  Skin:    General: Skin is warm and dry.     Coloration: Skin is not jaundiced.     Findings: No bruising, erythema or rash.  Neurological:     General: No focal deficit present.     Mental Status: She is alert and oriented to person, place, and time.     Motor: No weakness.     Gait: Gait normal.       Results for orders placed or performed in visit on 11/28/24  POC Covid19/Flu A&B Antigen  Result Value Ref Range   Influenza A Antigen, POC Negative Negative   Influenza B Antigen, POC Negative Negative   Covid Antigen, POC Negative Negative    Last CBC Lab Results  Component Value Date   WBC 8.9 11/23/2023   HGB 13.5 11/23/2023   HCT 41.3 11/23/2023   MCV 91 11/23/2023   MCH 29.6 11/23/2023   RDW 12.2 11/23/2023   PLT 307 11/23/2023   Last metabolic panel Lab Results  Component Value Date   GLUCOSE 98 09/30/2024   NA 142 09/30/2024   K 4.3 09/30/2024   CL 100 09/30/2024  CO2 27 09/30/2024   BUN 13 09/30/2024   CREATININE 0.91 09/30/2024   EGFR 70 09/30/2024   CALCIUM  9.6 09/30/2024   PROT 6.4 09/30/2024   ALBUMIN 3.8 (L) 09/30/2024   LABGLOB 2.6 09/30/2024   BILITOT 0.4 09/30/2024   ALKPHOS 100 09/30/2024   AST 18 09/30/2024   ALT 13 09/30/2024   Last lipids Lab Results  Component Value Date   CHOL 274 (H) 11/23/2023   HDL 87 11/23/2023   LDLCALC 177 (H) 11/23/2023   TRIG 64 11/23/2023   CHOLHDL 3.1 11/23/2023   Last hemoglobin A1c Lab Results  Component Value Date   HGBA1C 5.2 11/23/2023   Last thyroid functions Lab Results  Component Value Date   TSH 2.040 11/23/2023   FREET4 1.25 11/23/2023   Last vitamin D  Lab Results  Component Value Date   VD25OH 48.3 11/23/2023   Last vitamin B12 and Folate Lab Results  Component Value Date   VITAMINB12 550 12/19/2015      The 10-year ASCVD risk score (Arnett DK, et al., 2019) is: 6.7%  Assessment & Plan:  Mild intermittent asthmatic bronchitis with acute exacerbation -     Azithromycin ; Take 2 tablets on day 1, then 1 tablet daily on days 2 through 5  Dispense: 6 tablet; Refill: 0 -     Levocetirizine Dihydrochloride ; Take 1 tablet (5 mg total) by mouth every evening.  Dispense: 30 tablet; Refill: 0 -     Hydrocod Poli-Chlorphe Poli ER; Take 5 mLs by mouth every 12 (twelve) hours as  needed for cough (cough, will cause drowsiness.).  Dispense: 70 mL; Refill: 0 -     POC Covid19/Flu A&B Antigen  Assessment and Plan    Acute asthmatic bronchitis with exacerbation Symptoms include nasal congestion, sore throat, and cough with brown sputum. Differential includes viral infection such as COVID-19 or influenza. - COVID-19 and influenza test performed in office - negative - Start azithromycin  - start xyzal  - use current inhaler 2 puffs every 4-6 hours PRN - cough med Bid as needed.  Rheumatoid arthritis Managed with sulfasalazine . Budesonide  discontinued due to severe diarrhea.         No follow-ups on file.   Benton LITTIE Gave, PA

## 2024-12-12 ENCOUNTER — Ambulatory Visit: Payer: PRIVATE HEALTH INSURANCE | Admitting: Physician Assistant

## 2024-12-12 ENCOUNTER — Encounter: Payer: Self-pay | Admitting: Physician Assistant

## 2024-12-12 VITALS — BP 118/70 | HR 76 | Ht 60.0 in | Wt 201.0 lb

## 2024-12-12 DIAGNOSIS — I10 Essential (primary) hypertension: Secondary | ICD-10-CM | POA: Diagnosis not present

## 2024-12-12 DIAGNOSIS — R770 Abnormality of albumin: Secondary | ICD-10-CM

## 2024-12-12 DIAGNOSIS — M0579 Rheumatoid arthritis with rheumatoid factor of multiple sites without organ or systems involvement: Secondary | ICD-10-CM

## 2024-12-12 DIAGNOSIS — K580 Irritable bowel syndrome with diarrhea: Secondary | ICD-10-CM | POA: Diagnosis not present

## 2024-12-12 DIAGNOSIS — R058 Other specified cough: Secondary | ICD-10-CM | POA: Diagnosis not present

## 2024-12-12 MED ORDER — DICYCLOMINE HCL 10 MG PO CAPS: 10.0000 mg | ORAL_CAPSULE | Freq: Three times a day (TID) | ORAL | 0 refills | Status: DC

## 2024-12-12 MED ORDER — LOSARTAN POTASSIUM-HCTZ 100-12.5 MG PO TABS: 1.0000 | ORAL_TABLET | Freq: Every day | ORAL | 1 refills | Status: DC

## 2024-12-12 NOTE — Patient Instructions (Signed)
Irritable Bowel Syndrome, Adult  Irritable bowel syndrome (IBS) is a group of symptoms that affects the organs responsible for digestion (gastrointestinal tract, or GI tract). IBS is not one specific disease. To regulate how the GI tract works, the body sends signals back and forth between the intestines and the brain. If you have IBS, there may be a problem with these signals. As a result, the GI tract does not function normally. The intestines may become more sensitive and overreact to certain things. This may be especially true when you eat certain foods or when you are under stress. There are four main types of IBS. These may be determined based on the consistency of your stool (feces): IBS with mostly (predominance of) diarrhea. IBS with predominance of constipation. IBS with mixed bowel habits. This includes both diarrhea and constipation. IBS unclassified. This includes IBS that cannot be categorized into one of the other three main types. It is important to know which type of IBS you have. Certain treatments are more likely to be helpful for certain types of IBS. What are the causes? The exact cause of IBS is not known. What increases the risk? You may have a higher risk for IBS if you: Are female. Are younger than 40 years. Have a family history of IBS. Have a mental health condition, such as depression, anxiety, or post-traumatic stress disorder. Have had a bacterial infection of your GI tract. What are the signs or symptoms? Symptoms of IBS vary from person to person. The main symptom is abdominal pain or discomfort. Other symptoms usually include one or more of the following: Diarrhea, constipation, or both. Swelling or bloating in the abdomen. Feeling full after eating a small or regular-sized meal. Frequent gas. Mucus in the stool. A feeling of having more stool left after a bowel movement. Symptoms tend to come and go. They may be triggered by stress, mental health  conditions, or certain foods. How is this diagnosed? This condition may be diagnosed based on a physical exam, your medical history, and your symptoms. You may have tests, such as: Blood tests. Stool test. Colonoscopy. This is a procedure in which your GI tract is viewed with a long, thin, flexible tube. How is this treated? There is no cure for IBS, but treatment can help relieve symptoms. Treatment depends on the type of IBS you have, and may include: Changes to your diet, such as: Avoiding foods that cause symptoms. Drinking more water. Following a low-FODMAP (fermentable oligosaccharides, disaccharides, monosaccharides, and polyols) diet for up to 6 weeks, or as told by your health care provider. FODMAPs are sugars that are hard for some people to digest. Eating more fiber. Eating small meals at the same times every day. Medicines. These may include: Fiber supplements, if you have constipation. Medicine to control diarrhea (antidiarrheal medicines). Medicine to help control muscle tightening (spasms) in your GI tract (antispasmodic medicines). Medicines to help with mental health conditions, such as antidepressants. Talk therapy or counseling. Working with a dietitian to help create a food plan that is right for you. Managing your stress. Follow these instructions at home: Eating and drinking  Eat a healthy diet. Eat 5-6 small meals a day. Try to eat meals at about the same times each day. Do not eat large meals. Gradually eat more fiber-rich foods. These include whole grains, fruits, and vegetables. This may be especially helpful if you have IBS with constipation. Eat a diet low in FODMAPs. You may need to avoid foods such as   citrus fruits, cabbage, garlic, and onions. Drink enough fluid to keep your urine pale yellow. Keep a journal of foods that seem to trigger symptoms. Avoid foods and drinks that: Contain added sugar. Make your symptoms worse. These may include dairy  products, caffeinated drinks, and carbonated drinks. Alcohol use Do not drink alcohol if: Your health care provider tells you not to drink. You are pregnant, may be pregnant, or are planning to become pregnant. If you drink alcohol: Limit how much you have to: 0-1 drink a day for women. 0-2 drinks a day for men. Know how much alcohol is in your drink. In the [place] one drink equals one 12 oz bottle of beer (355 mL), one 5 oz glass of wine (148 mL), or one 1 oz glass of hard liquor (44 mL) General instructions Take over-the-counter and prescription medicines only as told by your health care provider. This includes supplements. Get enough exercise. Do at least 150 minutes of moderate-intensity exercise each week. Manage your stress. Getting enough sleep and exercise can help you manage stress. Keep all follow-up visits. This is important. This includes all visits with your health care provider and therapist. Where to find more information International Foundation for Functional Gastrointestinal Disorders: aboutibs.org National Institute of Diabetes and Digestive and Kidney Diseases: niddk.nih.gov Contact a health care provider if: You have constant pain. You lose weight. You have diarrhea that gets worse. You have bleeding from the rectum. You vomit often. You have a fever. Get help right away if: You have severe abdominal pain. You have diarrhea with symptoms of dehydration, such as dizziness or dry mouth. You have bloody or black stools. You have severe abdominal bloating. You have vomiting that does not stop. You have blood in your vomit. Summary Irritable bowel syndrome (IBS) is not one specific disease. It is a group of symptoms that affects digestion. Your intestines may become more sensitive and overreact to certain things. This may be especially true when you eat certain foods or when you are under stress. There is no cure for IBS, but treatment can help relieve  symptoms. This information is not intended to replace advice given to you by your health care provider. Make sure you discuss any questions you have with your health care provider. Document Revised: 11/20/2021 Document Reviewed: 11/20/2021 Elsevier Patient Education  2024 [employer] Inc.  

## 2024-12-12 NOTE — Progress Notes (Unsigned)
 "  Established Patient Office Visit  Subjective   Patient ID: April Norman, female    DOB: 26-Dec-1955  Age: 68 y.o. MRN: [id number]  Chief Complaint  Patient presents with   Medical Management of Chronic Issues    HPI .SABRADiscussed the use of AI scribe software for clinical note transcription with the patient, who gave verbal consent to proceed.  History of Present Illness ANETRA Norman is a 68 year old female with hypertension who presents with ongoing gastrointestinal issues and recent respiratory symptoms.  Gastrointestinal symptoms - Ongoing gastrointestinal upset since February - Severe stomach upset after consuming greasy foods such as bacon and eggs, disrupting daily activities - Urgent need to use the bathroom after eating - No constipation or abdominal pain - No other digestive issues aside from postprandial gastrointestinal upset - Lack of hunger and avoidance of eating between meals despite elevated weight - CT scan and endoscopy showed no significant findings except for throat erythema  Medication-related gastrointestinal distress - Previously trialed a medication for rheumatoid arthritis starting with 'L', which worsened gastrointestinal symptoms and was discontinued - Currently takes sulfasalazine  and hydroxychloroquine  daily for rheumatoid arthritis - Stopped infusions for rheumatoid arthritis in favor of a pill, which initially was tolerated but later caused significant gastrointestinal distress, leading to discontinuation  Hypertension - Monitors blood pressure at home and always below 130/80.  - Recent mild elevation in blood pressure, attributed to physical activity coming into office today - No changes in antihypertensive medications - No new antihypertensive medications prescribed during recent illness  Recent respiratory illness - Recent severe cold treated with azithromycin , Xyzal , and cough syrup - Cough syrup improved sleep - Persistent mild chest  congestion    ROS See HPI.    Objective:     BP 118/70   Pulse 76   Ht 5' (1.524 m)   Wt 201 lb (91.2 kg)   SpO2 99%   BMI 39.26 kg/m  BP Readings from Last 3 Encounters:  12/12/24 118/70  11/28/24 116/72  06/10/24 (!) 128/57   Wt Readings from Last 3 Encounters:  12/12/24 201 lb (91.2 kg)  11/28/24 198 lb (89.8 kg)  06/10/24 209 lb 1.9 oz (94.9 kg)      Physical Exam Constitutional:      Appearance: Normal appearance. She is obese.  HENT:     Head: Normocephalic.  Cardiovascular:     Rate and Rhythm: Normal rate and regular rhythm.  Pulmonary:     Effort: Pulmonary effort is normal.     Breath sounds: Normal breath sounds. No wheezing or rhonchi.  Musculoskeletal:     Cervical back: Normal range of motion and neck supple.     Right lower leg: No edema.     Left lower leg: No edema.  Neurological:     General: No focal deficit present.     Mental Status: She is alert and oriented to person, place, and time.  Psychiatric:        Mood and Affect: Mood normal.      The 10-year ASCVD risk score (Arnett DK, et al., 2019) is: 6.9%    Assessment & Plan:  SABRASABRASabina was seen today for medical management of chronic issues.  Diagnoses and all orders for this visit:  Essential hypertension -     losartan -hydrochlorothiazide (HYZAAR) 100-12.5 MG tablet; Take 1 tablet by mouth daily.  Rheumatoid arthritis involving multiple sites with positive rheumatoid factor (HCC)  Irritable bowel syndrome with diarrhea -  dicyclomine  (BENTYL ) 10 MG capsule; Take 1 capsule (10 mg total) by mouth 3 (three) times daily before meals.  Low serum albumin -     CMP14+EGFR   Assessment & Plan Irritable bowel syndrome with diarrhea Symptoms consistent with IBS-D, exacerbated by greasy foods. Previous evaluations showed no significant pathology. Symptoms improved after discontinuing a medication. - Prescribed Bentyl  twice daily with largest meals. - Provided IBS-D educational  handout. - Ensure adequate fiber intake. - CMP to recheck albumin level due to hx of low albumin.   RA - managed by rheumatology - on sulfasalazine /hydroxychloroquine  - recently stopped budesonide   Essential hypertension Blood pressure slightly elevated during visit, likely due to recent activity. Home readings generally well-controlled. - Rechecked blood pressure during visit and looks great.  - Refilled hyzaar 100/12.5mg  daily.   Post viral cough - finish all medications for bronchitis - lungs sound good - reassurance given that cough can take some time to completely resolve.      Return in about 3 months (around 03/12/2025).    Geordie Nooney, PA-C  "

## 2024-12-13 ENCOUNTER — Telehealth: Payer: Self-pay

## 2024-12-13 ENCOUNTER — Ambulatory Visit: Payer: Self-pay | Admitting: Physician Assistant

## 2024-12-13 DIAGNOSIS — E8809 Other disorders of plasma-protein metabolism, not elsewhere classified: Secondary | ICD-10-CM

## 2024-12-13 DIAGNOSIS — E7800 Pure hypercholesterolemia, unspecified: Secondary | ICD-10-CM

## 2024-12-13 DIAGNOSIS — E559 Vitamin D deficiency, unspecified: Secondary | ICD-10-CM

## 2024-12-13 DIAGNOSIS — R770 Abnormality of albumin: Secondary | ICD-10-CM | POA: Insufficient documentation

## 2024-12-13 DIAGNOSIS — R058 Other specified cough: Secondary | ICD-10-CM | POA: Insufficient documentation

## 2024-12-13 DIAGNOSIS — E669 Obesity, unspecified: Secondary | ICD-10-CM

## 2024-12-13 DIAGNOSIS — K580 Irritable bowel syndrome with diarrhea: Secondary | ICD-10-CM

## 2024-12-13 LAB — CMP14+EGFR
ALT: 12 IU/L (ref 0–32)
AST: 18 IU/L (ref 0–40)
Albumin: 3.6 g/dL — ABNORMAL LOW (ref 3.9–4.9)
Alkaline Phosphatase: 80 IU/L (ref 49–135)
BUN/Creatinine Ratio: 18 (ref 12–28)
BUN: 13 mg/dL (ref 8–27)
Bilirubin Total: 0.3 mg/dL (ref 0.0–1.2)
CO2: 26 mmol/L (ref 20–29)
Calcium: 9.3 mg/dL (ref 8.7–10.3)
Chloride: 102 mmol/L (ref 96–106)
Creatinine, Ser: 0.74 mg/dL (ref 0.57–1.00)
Globulin, Total: 2.6 g/dL (ref 1.5–4.5)
Glucose: 94 mg/dL (ref 70–99)
Potassium: 4 mmol/L (ref 3.5–5.2)
Sodium: 143 mmol/L (ref 134–144)
Total Protein: 6.2 g/dL (ref 6.0–8.5)
eGFR: 89 mL/min/1.73

## 2024-12-13 MED ORDER — DICYCLOMINE HCL 10 MG PO CAPS: 10.0000 mg | ORAL_CAPSULE | Freq: Three times a day (TID) | ORAL | 0 refills | Status: AC

## 2024-12-13 NOTE — Progress Notes (Signed)
 Albumin still low. Thoughts on a referral to nutritionist to help you with food choices with protein?

## 2024-12-13 NOTE — Telephone Encounter (Signed)
 Copied from CRM #[id number]. Topic: Clinical - Prescription Issue >> Dec 13, 2024 10:01 AM Zebedee SAUNDERS wrote: Reason for CRM: Pt called stated needs dicyclomine  (BENTYL ) 10 MG capsule sent to [facility] #[id number] -[address] [place] [address] Phone: [phone] Fax: [phone]. It was sent to another pharmacy does not use.

## 2024-12-21 DIAGNOSIS — M0579 Rheumatoid arthritis with rheumatoid factor of multiple sites without organ or systems involvement: Secondary | ICD-10-CM | POA: Diagnosis not present

## 2024-12-21 DIAGNOSIS — K52831 Collagenous colitis: Secondary | ICD-10-CM | POA: Diagnosis not present

## 2024-12-21 DIAGNOSIS — Z6838 Body mass index (BMI) 38.0-38.9, adult: Secondary | ICD-10-CM | POA: Diagnosis not present

## 2024-12-21 DIAGNOSIS — Z79899 Other long term (current) drug therapy: Secondary | ICD-10-CM | POA: Diagnosis not present

## 2024-12-21 DIAGNOSIS — E669 Obesity, unspecified: Secondary | ICD-10-CM | POA: Diagnosis not present

## 2024-12-30 NOTE — Telephone Encounter (Signed)
 Need referral for nourish nutiriontist to help with weight loss/increasing protein/decreasing cholesterol.

## 2025-01-18 ENCOUNTER — Other Ambulatory Visit: Payer: Self-pay | Admitting: Physician Assistant

## 2025-01-18 DIAGNOSIS — I10 Essential (primary) hypertension: Secondary | ICD-10-CM

## 2025-01-23 ENCOUNTER — Encounter: Payer: Self-pay | Admitting: Physician Assistant

## 2025-01-23 ENCOUNTER — Telehealth (INDEPENDENT_AMBULATORY_CARE_PROVIDER_SITE_OTHER): Payer: Medicare (Managed Care) | Admitting: Physician Assistant

## 2025-01-23 DIAGNOSIS — J40 Bronchitis, not specified as acute or chronic: Secondary | ICD-10-CM

## 2025-01-23 DIAGNOSIS — J329 Chronic sinusitis, unspecified: Secondary | ICD-10-CM | POA: Diagnosis not present

## 2025-01-23 MED ORDER — AZITHROMYCIN 250 MG PO TABS: ORAL_TABLET | ORAL | 0 refills | Status: AC

## 2025-01-23 MED ORDER — HYDROCODONE BIT-HOMATROP MBR 5-1.5 MG/5ML PO SOLN: 5.0000 mL | Freq: Three times a day (TID) | ORAL | 0 refills | Status: AC | PRN

## 2025-01-23 MED ORDER — ALBUTEROL SULFATE HFA 108 (90 BASE) MCG/ACT IN AERS: 2.0000 | INHALATION_SPRAY | Freq: Four times a day (QID) | RESPIRATORY_TRACT | 0 refills | Status: AC | PRN

## 2025-01-23 MED ORDER — METHYLPREDNISOLONE 4 MG PO TBPK: ORAL_TABLET | ORAL | 0 refills | Status: AC

## 2025-01-23 MED ORDER — BENZONATATE 200 MG PO CAPS: 200.0000 mg | ORAL_CAPSULE | Freq: Three times a day (TID) | ORAL | 0 refills | Status: AC | PRN

## 2025-03-13 ENCOUNTER — Ambulatory Visit: Payer: Medicare (Managed Care) | Admitting: Physician Assistant
